# Patient Record
Sex: Male | Born: 1945 | Hispanic: Yes | Smoking: Former smoker
Health system: Southern US, Community
[De-identification: ages and names within clinical notes are randomized; demographics above are authoritative.]

---

## 2006-01-24 HISTORY — PX: INGUINAL HERNIA REPAIR: SUR1180

## 2008-01-25 HISTORY — PX: BRAIN SURGERY: SHX531

## 2011-11-01 DIAGNOSIS — J343 Hypertrophy of nasal turbinates: Secondary | ICD-10-CM | POA: Diagnosis not present

## 2011-11-01 DIAGNOSIS — H905 Unspecified sensorineural hearing loss: Secondary | ICD-10-CM | POA: Diagnosis not present

## 2011-11-01 DIAGNOSIS — J309 Allergic rhinitis, unspecified: Secondary | ICD-10-CM | POA: Diagnosis not present

## 2013-04-22 ENCOUNTER — Ambulatory Visit (INDEPENDENT_AMBULATORY_CARE_PROVIDER_SITE_OTHER): Payer: Medicare Other | Admitting: Internal Medicine

## 2013-04-22 VITALS — BP 118/70 | HR 63 | Temp 97.7°F | Resp 16 | Ht 63.0 in | Wt 136.0 lb

## 2013-04-22 DIAGNOSIS — L500 Allergic urticaria: Secondary | ICD-10-CM | POA: Diagnosis not present

## 2013-04-22 MED ORDER — CETIRIZINE HCL 10 MG PO TABS: 10.0000 mg | ORAL_TABLET | Freq: Two times a day (BID) | ORAL | Status: DC

## 2013-04-22 NOTE — Progress Notes (Signed)
   Subjective:    Patient ID: Alexander Bass, male    DOB: 07/22/1945, 68 y.o.   MRN: [id number]  HPI itchy rash his chief complaint  Facial itching x 4 days intermittent, causes intermittent waking at night due to itch, no new exposures to sprays or smoke,  No runny nose or burning in eyes. rash also noted on neck and on left forearm, and occasionally in genital area and around lower abdomen Penis was itching 2 days ago, has  Since resolved, no problem with urination.  No bruising on body.  No certain new contacts or foods  Past medical history-describes self as healthy with no problems and no medications [place]  Review of Systems No fever chills night sweats or weight loss No allergy symptoms or cough    Objective:   Physical Exam BP 118/70  Pulse 63  Temp(Src) 97.7 F (36.5 C) (Oral)  Resp 16  Ht 5\' 3"  (1.6 m)  Wt 136 lb (61.689 kg)  BMI 24.10 kg/m2  SpO2 100% HEENT clear Skin with resolving and the left forearm and around neck and mild hyperemia of her face that is fading  No adenopathy No petechia or ecchymoses/no vesicular lesions       Assessment & Plan:  Urticaria - Zyrtec bid x 10 days and followup if relapses  [facility] [place] [facility]-

## 2013-05-13 ENCOUNTER — Encounter: Payer: Self-pay | Admitting: *Deleted

## 2013-05-16 ENCOUNTER — Encounter: Payer: Self-pay | Admitting: Nurse Practitioner

## 2013-05-16 ENCOUNTER — Ambulatory Visit (INDEPENDENT_AMBULATORY_CARE_PROVIDER_SITE_OTHER): Payer: Medicare Other | Admitting: Nurse Practitioner

## 2013-05-16 VITALS — BP 124/70 | HR 63 | Temp 96.5°F | Ht 64.25 in | Wt 134.4 lb

## 2013-05-16 DIAGNOSIS — R5381 Other malaise: Secondary | ICD-10-CM

## 2013-05-16 DIAGNOSIS — R079 Chest pain, unspecified: Secondary | ICD-10-CM

## 2013-05-16 DIAGNOSIS — R5383 Other fatigue: Secondary | ICD-10-CM | POA: Diagnosis not present

## 2013-05-16 DIAGNOSIS — Z8249 Family history of ischemic heart disease and other diseases of the circulatory system: Secondary | ICD-10-CM | POA: Diagnosis not present

## 2013-05-16 NOTE — Progress Notes (Addendum)
Patient ID: Alexander Bass, male   DOB: [birth year], 68 y.o.   MRN: [id number]    No Known Allergies  Chief Complaint  Patient presents with  . Establish Care    NP-Establish Care  . Immunizations    declines Shingles, never had Pneumo, and Td was gotten ? when  . other    dizzy (every 3 days), pain in Abdomen after eating, can't hear out of LT ear    HPI: Patient is a 68 y.o. male seen in the office today to establish care Feeling weak and no energy which has been ongoing for a month, sleeping well at night, never been a big eater but has not had a change in appetite. No unexplained weight loss.   Having abdominal discomfort for 2 months, on and off after he eats. Pointing to left middle quadrant Does not report discomfort now. "little bit" of pain, once a week Does not know about screening colonoscopy  Report chest pains occasionally when exercising. Does not happen often, does not associate with shortness of breath or diaphoresis and reports it is really more of a discomfort or tightness, may be related to anxiety Review of Systems:  Review of Systems  Constitutional: Positive for malaise/fatigue. Negative for fever, chills and weight loss.  HENT: Positive for congestion. Negative for ear discharge, ear pain, sore throat and tinnitus. 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   Eyes: Negative for blurred vision.  Respiratory: Negative for cough and shortness of breath.   Cardiovascular: Negative for chest pain and palpitations.  Gastrointestinal: Negative for heartburn, nausea, vomiting, abdominal pain, diarrhea, constipation, blood in stool (history of blood in his stool) and melena.  Genitourinary: Negative for dysuria, urgency and frequency.       Gets up about 2 times at night to urinate  Musculoskeletal: Positive for back pain (at times). Negative for falls, myalgias and neck pain.  Skin: Negative for itching and  rash.  Neurological: Positive for dizziness (at times), tingling (in arms and legs "a little bit") and weakness. Negative for headaches.  Endo/Heme/Allergies: Positive for environmental allergies.  Psychiatric/Behavioral: Positive for depression (at times). The patient is nervous/anxious (son with mental illness causes stress and worry ). The patient does not have insomnia.       History reviewed. No pertinent past medical history. Past Surgical History  Procedure Laterality Date  . 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 Inguinal hernia repair Right 2008   Social History:   reports that he quit smoking about 15 years ago. He has never used smokeless tobacco. He reports that he drinks alcohol. He reports that he does not use illicit drugs.  Family History  Problem Relation Age of Onset  . Cancer Mother   . Heart attack Father   . Heart disease Brother     Medications: Patient's Medications  New Prescriptions   No medications on file  Previous Medications   NON FORMULARY    Vitamin Maka (Puru vitamin) 1 tablet daily  As needed.   VITAMIN E 400 UNIT CAPSULE    Take 400 Units by mouth daily.  Modified Medications   No medications on file  Discontinued Medications   CETIRIZINE (ZYRTEC) 10 MG TABLET    Take 1 tablet (10 mg total) by mouth 2 (two) times daily.     Physical Exam:  Filed Vitals:  05/16/13 0908 05/16/13 0933 05/16/13 0934 05/16/13 0935  BP: 130/76 130/70 120/66 124/70  Pulse: 64 56 57 63  Temp: 96.5 F (35.8 C)     TempSrc: Oral     Height: 5' 4.25" (1.632 m)     Weight: 134 lb 6.4 oz (60.963 kg)       Physical Exam  Constitutional: He is oriented to person, place, and time and well-developed, well-nourished, and in no distress.  HENT:  Head: Normocephalic and atraumatic.  Right Ear: External ear normal.  Left Ear: External ear normal.  Nose: Nose normal.  Mouth/Throat: Oropharynx is clear and moist. No oropharyngeal exudate.    Eyes: Conjunctivae and EOM are normal. Pupils are equal, round, and reactive to light.  Neck: Normal range of motion. Neck supple. No thyromegaly present.  Cardiovascular: Normal rate, regular rhythm and normal heart sounds.   Pulmonary/Chest: Effort normal and breath sounds normal. No respiratory distress. He has no wheezes. He exhibits no tenderness.  Abdominal: Soft. Bowel sounds are normal. He exhibits no distension and no mass. There is no tenderness. There is no rebound and no guarding.  Musculoskeletal: Normal range of motion. He exhibits no edema and no tenderness.  Lymphadenopathy:    He has no cervical adenopathy.  Neurological: He is alert and oriented to person, place, and time.  Skin: Skin is warm and dry.  Psychiatric: Affect normal.     Assessment/Plan 1. Malaise and fatigue -encouraged proper eating habits, will follow up blood work to see if there are any abnormalities, pt not on medications; has not had routine medical care in many years and overdue for blood work - CBC With differential/Platelet - TSH - Comprehensive metabolic panel - EKG 12-Lead  2. Family history of heart disease -egk done WNL , will get lipids  3. Chest pain, unspecified -rare occurrence -EKG done to rule out ischemia without abnormal findings and will get lipid panel at this time   Will have pt follow up in 4 weeks for EV with MMSE due to fatigue and malaise will hold off on pneumonia and tdap vaccines until next visit

## 2013-05-16 NOTE — Patient Instructions (Addendum)
Will get blood work today to evaluate your fatigue  Follow up in 4-6 weeks for physical   Dieta para cuidar el corazn  (Cardiac Diet) Esta dieta puede ayudarlo a prevenir enfermedades del corazn y el ictus. Hay muchos factores que influyen en la salud del corazn como los hbitos alimentarios y la actividad fsica. El riesgo coronario aumenta mucho con los niveles anormales de grasa (lpidos) en la Great Fallssangre. La planificacin de las comidas para cuidar el corazn incluye IAC/InterActiveCorplimitar las grasas no saludables, aumentar las grasas saludables y Radio producerhacer otros pequeos cambios en la dieta. Las pautas generales son:   Ajuste el consumo de caloras para Baristaalcanzar y Pharmacologistmantener un Runner, broadcasting/film/videopeso corporal deseable.  Limite la ingesta de grasas a alrededor del 30% del total de caloras que consume. La cantidad de grasas saturadas debe ser menor al 7% de las caloras.  Las grasas saturadas se encuentran en los productos derivados de animales y en algunos productos vegetales. Las grasas saturadas vegetales estn en el aceite de coco, la Bathmanteca de cacao, el aceite de palma y el aceite de Sparksalmendra de palma. Lea las etiquetas cuidadosamente para evitar estos productos tanto como sea posible. Utilice la mantequilla con moderacin. Elija margarinas de tubo y Enterprise Productsaceites que contienen tienen 2 gramos de grasa o La Grangemenos. Los aceites buenos para cocinar son el de canola y el de [place] Richey Eastoliva.  Aprenda tcnicas para cocinar con poca grasa. No  cocine los alimentos en fritura. En cambio puede asar, hornear, hervir, cocinar al vapor, a la parrilla, asar en la parrilla, salteado o en el microondas. Otras sugerencias para reducir las grasas son:  Chief Strategy OfficerQuitar la piel de las aves.  Retirar toda la grasa visible de las carnes.  Retire la grasa de los guisos, sopas y salsas antes de Secretary/administratorservir.  Cocine las verduras al vapor en agua o caldo en vez de saltearlos en grasa.  Evite los alimentos con grasas trans (aceites hidrogenados), como los alimentos fritos y los  productos comerciales horneados. La Phillips Groutmanteca y las grasas para frer contienen grasas trans.  Aumente el consumo de frutas, verduras, cereales integrales y legumbres para reemplazar los alimentos con alto contenido de Avery Creekgrasa.  Aumente el consumo de frutos secos, legumbres y semillas a por lo menos 4 porciones semanales. Una porcin de legumbre equivale a  taza y 1 porcin de nueces o semillas equivale a  de taza.  Elija granos enteros con ms frecuencia. Consuma 3 porciones al da (una porcin es 1 onza [oz] o 28 gr).  Coma 4 a 5 porciones de vegetales por da. Una porcin de vegetales equivale a 1 taza de verduras de hoja crudas,  taza de vegetales trozados crudos o cocidos,  taza de jugo de verduras.  Consuma 4 a 5 porciones de fruta al C.H. Robinson Worldwideda. Una porcin de fruta equivale a 1 fruta mediana entera,  de taza de fruta seca,  taza de fruta fresca, congelada o enlatada,  taza de100% jugo de fruta .  Aumente el consumo de fibra dietaria de 20 a 30 gramos por Futures traderda. La fibra insoluble ayuda a reducir el riesgo de enfermedades cardacas y a Scientist, water qualityfrenar el apetito.  La fibra soluble se une al colesterol para eliminarlo de Risk managerla sangre. Los alimentos con alto contenido de Guyanafibra soluble son legumbres secas, ctricos,avena, manzanas, bananas, brcoli, repollitos de Bruselas y berenjenas.   Incluya alimentos enriquecidos con esteroles o General Motorsestanoles vegetales, como el yogur, panes, jugos, o Courtlandmargarinas. Elija alimentos fortificados para lograr una ingesta diaria de 2 a 3 gramos  de esteroles o estanoles vegetales.  Los alimentos con grasas omega-3 ayudan a Software engineerreducir el riesgo de enfermedades cardacas. Consuma una porcin de 3,5 onzas (100 gr) de Merck & Copescado graso dos veces por semana, como salmn, caballa, atn Tano Roadalbacora, sardinas, trucha de Evanstonlago, o arenque. Si desea tomar un suplemento de aceite de pescado, elija uno que contenga 1 gr de DHA y EPA.  Limite el consumo de carnes procesadas a 2 porciones (porcin de 3 oz [85  gr]) semanales.  Limite el sodio en la dieta a 1500 miligramos (mg) por da. Si sufre hipertensin, hable con un nutricionista matriculado cerca del plan de alimentacin DASH (siglas en ingls de Enfoques Alimentarios para Detener la Hipertensin ) .  Limite los dulces y las bebidas con azcar agregado, como gaseosas, a no ms de 5 porciones por semana. Una porcin es:   1 cucharada de azcar.  1 cucharada de Palestinian Territoryjalea o mermelada.   taza de sorbete.  1 taza de limonada.   taza de gaseosa comn. ELECCIN DE LOS ALIMENTOS  FCULAS  Permitidos: Panes: [address]odos los tipos (trigo, centeno, de pasas, Garden Prairieblanco, de Minneolaavena, New Auburnitaliano, francs y Patent examinerMuffin Ingls). Rollos con bajo contenido en grasa: Muffins ingleses, panecillos frankfurt y de hamburguesas, bagels, pan de pita, tortillas (no fritas). Panqueques, waffles, galletas y magdalenas hechas con aceite recomendado.  Evite: Productos elaborados con grasas saturadas o trans, aceites o productos elaborados con Eastman Kodakleche entera. Panecillos de mantequilla, pan de queso, croissants. Donas, muffins, panecillos dulces, galletas, waffles, panqueques, mezclas compradas en la tienda . Galletas   Permitidos: GafferGalletas y snacks bajos en grasa: GafferGalletas con forma de Fairmeadanimales, de graham, centeno, de agua (con el aceite recomendado, sin Colorado Citymanteca de cerdo), la Mesquiteostra, y las galletas de Honokaamatz. Grisines, biscotes, bay biscuits, pan rabe, pretzels y palomitas de USAAmaiz diet.  Evite: Las galletas con alto contenido de grasa: Gaffergalletas de Hollandqueso, Vine Grovegalletas de Wenonamantequilla, y los hechas con aceite de de palma o grasas trans (aceites hidrogenados). Palomitas de maz con mantequilla. Cereales  Permitidos: Cereales integrales calientes o fros.  Evite: Cereales que contengan coco, grasa vegetal hidrogenada, o grasa animal. Patatas / pastas / arroz:  Permitidos: Todas las clases de papas, arroz y pasta (como Urbancrestmacarrones, espaguetis y fideos).  Evite: Pasta o arroz preparado con salsa  de crema o queso rico en grasas. Fideos chow mein, papas fritas. Vegetales:  Permitidos: Todos los vegetales y jugos de Sports administratorvegetales.  Evite: Verduras fritas Verduras con crema, mantequilla o salsas de queso con alto contenido de grasa. Limite el consumo de coco. La fruta con crema o natillas. Protenas  Permitidos: Limite el consumo de carnes, mariscos, y aves a no ms de 6 onzas (peso cocido) por Futures traderda. Safeco Corporationodos los cortes de carne magra de Winthropternera, cerdo y cordero. Pollo y pavo sin piel. Todos los pescados y Liberty Globalmariscos. Presas de caza: pato salvaje, conejo, faisn y venado. Las claras de CHS Inchuevo o sustitutos de [facility] Inchuevo con poco colesterol pueden usarse tanto como se desee. Platos sin carne: Recetas con judas secas, guisantes, lentejas y tofu (cuajada de soja). Semillas y frutos secos: todas las semillas y la mayora de los frutos secos.  Evite: Carnes "Prime" y otras carnes grasas y "veteadas" como costillas, costillas de cerdo, ojo de bife o bistec, salchichas, panceta y carnes con alto contenido de grasas y embutidos de cordero. Caviar Pescado frito comercial. Pato domstico, ganso, salchichas de venado. Vsceras: hgado, molleja, corazn, chinchulines, riones, sesos. Lcteos:  Permitidos: Quesos bajos en grasa: queso cottage sin grasa o Patrecia Pacebajo  en grasa (1% o 2%), quesos elaborados con Engelhard Corporation, como [place], de [place], en hebras o ricotta. (Los quesos que indican en su etiqueta que no tienen ms de 2 a 6 gramos de grasa por onza (28 gr.). Leche descremada (al 1%): lquida, en polvo, o evaporada. Mantequilla hecha con leche baja en grasa. Bebidas preparadas con PPG Industries o leche o cacao bajos en grasa . La leche con chocolate o cacao preparado con leche descremada o baja en grasa (1%). Yogur descremado o bajo en grasas.  Evite: Quesos de [address], incluyendo [place], [place], [place], [place], [place], [place], [place], [place], [place], y [place]. Queso cottage de crema,  queso crema. Leche entera y productos lcteos enteros, incluyendo [place] o yogurt elaborado con [place], [place] a base de [place]. Leche condensada, leche entera evaporada y de [place] al 2% Sopas y alimentos combinados  Permitidos: Sopas con bajo contenido de grasa y sodio: caldos, sopas deshidratadas, caldos caseros, sopas caseras eliminando la grasa, sopas cremas caseras hechas con leche descremada o baja en grasas. Espaguetis con bajo contenido de grasa, lasaa, chili y arroz espaoles si se elaboran con ingredientes bajos en grasa y se utilizan tcnicas de coccin sin grasas.  Evite: Sopas de crema hecha con Eastman Kodak, crema, quesos grasos. Todas las otras sopas. Postres y dulces  Permitidos: Sorbetes, helados de frutas, gelatinas, merengues y torta ngel. Postres caseros con grasas, aceites y productos lcteos recomendados. Mermelada, jalea, miel, gelatina, azcares y jarabes. Caramelos de Science Applications International, como gomitas, caramelos duros, caramelos de goma, malvaviscos, caramelos de menta, y pequeas cantidades de chocolate negro.  Evite: Tortas, pasteles, galletas, helados, budines comerciales o mezclas para preparar estos productos. Postres que Mellon Financial, chocolate, coco, Macy de [place], aceite de palma o aceite de [place]. Helado o bebidas heladas. Caramelos que contengan chocolate, coco, manteca, grasas hidrogenadas, o ingredientes desconocidos. Jarabes con mantequilla. Grasas y aceites  Permitidos: Aceites vegetales: de crtamo, girasol, maz, soja, ssamo, algodn, canola, oliva o cacahuete. Margarinas no hidrogenadas. Aderezos para ensaladas o mayonesa: casera o comercial preparadas con el aceite recomendado. Aderezo para ensalada o [place] con bajo contenido o sin grasas.  Limite las grasas y Enterprise Products agregue a 6 - 8 cucharaditas por da (incluye las grasas utilizadas en la cocina, [place], [place], y la que se extiende sobre el pan). Recuerde contar los  "grasas ocultas" en los alimentos.  Evite: Las grasas slidas: mantequilla, [place], tocino, grasa de tocino. Salsas de carne que contengan [place], [place] o sebo. La manteca de cacao, el coco. Aceite de [place], aceite de [place], aceite de Brune[place] Darussalam de palma o aceites hidrogenados: estos ingredientes son de uso frecuente en productos de panadera, cremas no lcteas, coberturas batidas, dulces, y alimentos fritos comerciales. Lea las etiquetas atentamente. Aderezos para ensaladas a base de aceites desconocidos, crema, o queso, como el queso azul y roquefort. [place]remas, todo tipo: mitad y [address], diet, entera, o batida. Queso crema o crema agria (aunque sea "light" o bajas en grasa). Sustitutos no lcteos de la crema: cremas para caf y sucedneos de la crema hechos con palma, nuez de palma, aceites hidrogenados o aceite de coco. Bebidas  Permitidos: Caf (comn o descafeinado), t. Gaseosas dietticas, agua mineral. Alcohol: Consulte con su mdico. Se recomienda beber con moderacin.  Evite: La leche entera, gaseosas comunes y jugos con Engineer, mining. Condimentos  Permitidos: Todos los condimentos y aderezos. Cacao en polvo. Salsas "cremosas" preparadas con los ingredientes recomendados.  Evite: Algarroba en polvo preparada con Rosalin Hawking  hidrogenadas. EJEMPLO DE MEN  Desayuno   taza de jugo de naranjas   taza de avena cocida  1 rebanada tostado  1 cucharada de margarina  1 taza de [place] descremada [place]:  Sandwich de pavo con 60 gr de pavo y 2 rebanadas de pan.  [place] y rodajas de tomate  Frutas frescas  Palitos de zanahoria  T o caf. Colacin  Frutas frescas o galletas sin contenido de [place]ale. Cena  3 onzas (100 gr. de carne picada)  1 patata al horno  1 cucharada de margarina   taza de esprragos  Ensalada de lechuga  1 cucharada de aderezo para ensaladas sin crema.   taza de rebanadas de pepino  1 taza de [place] descremada Document Released: 11/07/2008 Document  Revised: 07/12/2011 [facility] Patient Information 2014 [place], [place].

## 2013-05-17 ENCOUNTER — Encounter: Payer: Self-pay | Admitting: *Deleted

## 2013-05-17 LAB — CBC WITH DIFFERENTIAL
BASOS: 0 %
Basophils Absolute: 0 10*3/uL (ref 0.0–0.2)
EOS: 1 %
Eosinophils Absolute: 0 10*3/uL (ref 0.0–0.4)
HEMATOCRIT: 42.6 % (ref 37.5–51.0)
HEMOGLOBIN: 14.6 g/dL (ref 12.6–17.7)
Immature Grans (Abs): 0 10*3/uL (ref 0.0–0.1)
Immature Granulocytes: 0 %
LYMPHS ABS: 1.4 10*3/uL (ref 0.7–3.1)
Lymphs: 28 %
MCH: 30 pg (ref 26.6–33.0)
MCHC: 34.3 g/dL (ref 31.5–35.7)
MCV: 88 fL (ref 79–97)
MONOS ABS: 0.5 10*3/uL (ref 0.1–0.9)
Monocytes: 10 %
NEUTROS ABS: 3 10*3/uL (ref 1.4–7.0)
Neutrophils Relative %: 61 %
Platelets: 311 10*3/uL (ref 150–379)
RBC: 4.87 x10E6/uL (ref 4.14–5.80)
RDW: 13.1 % (ref 12.3–15.4)
WBC: 4.9 10*3/uL (ref 3.4–10.8)

## 2013-05-17 LAB — COMPREHENSIVE METABOLIC PANEL
ALBUMIN: 4.2 g/dL (ref 3.6–4.8)
ALT: 11 IU/L (ref 0–44)
AST: 22 IU/L (ref 0–40)
Albumin/Globulin Ratio: 1.4 (ref 1.1–2.5)
Alkaline Phosphatase: 87 IU/L (ref 39–117)
BILIRUBIN TOTAL: 0.4 mg/dL (ref 0.0–1.2)
BUN/Creatinine Ratio: 13 (ref 10–22)
BUN: 12 mg/dL (ref 8–27)
CHLORIDE: 105 mmol/L (ref 97–108)
CO2: 22 mmol/L (ref 18–29)
CREATININE: 0.93 mg/dL (ref 0.76–1.27)
Calcium: 9 mg/dL (ref 8.6–10.2)
GFR calc non Af Amer: 83 mL/min/{1.73_m2} (ref >59)
GFR, EST AFRICAN AMERICAN: 96 mL/min/{1.73_m2} (ref >59)
Globulin, Total: 3 g/dL (ref 1.5–4.5)
Glucose: 81 mg/dL (ref 65–99)
Potassium: 4.4 mmol/L (ref 3.5–5.2)
Sodium: 143 mmol/L (ref 134–144)
TOTAL PROTEIN: 7.2 g/dL (ref 6.0–8.5)

## 2013-05-17 LAB — LIPID PANEL
Chol/HDL Ratio: 3.5 ratio units (ref 0.0–5.0)
Cholesterol, Total: 221 mg/dL — ABNORMAL HIGH (ref 100–199)
HDL: 64 mg/dL (ref >39)
LDL Calculated: 144 mg/dL — ABNORMAL HIGH (ref 0–99)
TRIGLYCERIDES: 64 mg/dL (ref 0–149)
VLDL CHOLESTEROL CAL: 13 mg/dL (ref 5–40)

## 2013-05-17 LAB — TSH: TSH: 2.29 u[IU]/mL (ref 0.450–4.500)

## 2013-06-20 ENCOUNTER — Ambulatory Visit (INDEPENDENT_AMBULATORY_CARE_PROVIDER_SITE_OTHER): Payer: Medicare Other | Admitting: Nurse Practitioner

## 2013-06-20 ENCOUNTER — Encounter: Payer: Self-pay | Admitting: Nurse Practitioner

## 2013-06-20 VITALS — BP 122/64 | HR 58 | Temp 98.3°F | Ht 64.25 in | Wt 128.0 lb

## 2013-06-20 DIAGNOSIS — E785 Hyperlipidemia, unspecified: Secondary | ICD-10-CM | POA: Diagnosis not present

## 2013-06-20 DIAGNOSIS — J309 Allergic rhinitis, unspecified: Secondary | ICD-10-CM | POA: Diagnosis not present

## 2013-06-20 DIAGNOSIS — Z23 Encounter for immunization: Secondary | ICD-10-CM | POA: Diagnosis not present

## 2013-06-20 DIAGNOSIS — Z0000 Encounter for general adult medical examination without abnormal findings: Secondary | ICD-10-CM | POA: Diagnosis not present

## 2013-06-20 DIAGNOSIS — J302 Other seasonal allergic rhinitis: Secondary | ICD-10-CM

## 2013-06-20 NOTE — Progress Notes (Signed)
Passed clock drawing 

## 2013-06-20 NOTE — Patient Instructions (Signed)
Follow up in 3 months with fasting lipids prior to visit   Cont diet modifications and exercise  Cardiac Diet This diet can help prevent heart disease and stroke. Many factors influence your heart health, including eating and exercise habits. Coronary risk rises a lot with abnormal blood fat (lipid) levels. Cardiac meal planning includes limiting unhealthy fats, increasing healthy fats, and making other small dietary changes. General guidelines are as follows:  Adjust calorie intake to reach and maintain desirable body weight.  Limit total fat intake to less than 30% of total calories. Saturated fat should be less than 7% of calories.  Saturated fats are found in animal products and in some vegetable products. Saturated vegetable fats are found in coconut oil, cocoa butter, palm oil, and palm kernel oil. Read labels carefully to avoid these products as much as possible. Use butter in moderation. Choose tub margarines and oils that have 2 grams of fat or less. Good cooking oils are canola and olive oils.  Practice low-fat cooking techniques. Do not fry food. Instead, broil, bake, boil, steam, grill, roast on a rack, stir-fry, or microwave it. Other fat reducing suggestions include:  Remove the skin from poultry.  Remove all visible fat from meats.  Skim the fat off stews, soups, and gravies before serving them.  Steam vegetables in water or broth instead of sauting them in fat.  Avoid foods with trans fat (or hydrogenated oils), such as commercially fried foods and commercially baked goods. Commercial shortening and deep-frying fats will contain trans fat.  Increase intake of fruits, vegetables, whole grains, and legumes to replace foods high in fat.  Increase consumption of nuts, legumes, and seeds to at least 4 servings weekly. One serving of a legume equals  cup, and 1 serving of nuts or seeds equals  cup.  Choose whole grains more often. Have 3 servings per day (a serving is 1  ounce [oz]).  Eat 4 to 5 servings of vegetables per day. A serving of vegetables is 1 cup of raw leafy vegetables;  cup of raw or cooked cut-up vegetables;  cup of vegetable juice.  Eat 4 to 5 servings of fruit per day. A serving of fruit is 1 medium whole fruit;  cup of dried fruit;  cup of fresh, frozen, or canned fruit;  cup of 100% fruit juice.  Increase your intake of dietary fiber to 20 to 30 grams per day. Insoluble fiber may help lower your risk of heart disease and may help curb your appetite.  Soluble fiber binds cholesterol to be removed from the blood. Foods high in soluble fiber are dried beans, citrus fruits, oats, apples, bananas, broccoli, Brussels sprouts, and eggplant.  Try to include foods fortified with plant sterols or stanols, such as yogurt, breads, juices, or margarines. Choose several fortified foods to achieve a daily intake of 2 to 3 grams of plant sterols or stanols.  Foods with omega-3 fats can help reduce your risk of heart disease. Aim to have a 3.5 oz portion of fatty fish twice per week, such as salmon, mackerel, albacore tuna, sardines, lake trout, or herring. If you wish to take a fish oil supplement, choose one that contains 1 gram of both DHA and EPA.  Limit processed meats to 2 servings (3 oz portion) weekly.  Limit the sodium in your diet to 1500 milligrams (mg) per day. If you have high blood pressure, talk to a registered dietitian about a DASH (Dietary Approaches to Stop Hypertension) eating plan.  Limit sweets and beverages with added sugar, such as soda, to no more than 5 servings per week. One serving is:   1 tablespoon sugar.  1 tablespoon jelly or jam.   cup sorbet.  1 cup lemonade.   cup regular soda. CHOOSING FOODS Starches  Allowed: Breads: All kinds (wheat, rye, raisin, white, oatmeal, [place], [place], and English muffin bread). Low-fat rolls: English muffins, frankfurter and hamburger buns, bagels, pita bread, tortillas (not  fried). Pancakes, waffles, biscuits, and muffins made with recommended oil.  Avoid: Products made with saturated or trans fats, oils, or whole milk products. Butter rolls, cheese breads, croissants. Commercial doughnuts, muffins, sweet rolls, biscuits, waffles, pancakes, store-bought mixes. Crackers  Allowed: Low-fat crackers and snacks: Animal, graham, rye, saltine (with recommended oil, no lard), oyster, and matzo crackers. Bread sticks, melba toast, rusks, flatbread, pretzels, and light popcorn.  Avoid: High-fat crackers: cheese crackers, butter crackers, and those made with coconut, palm oil, or trans fat (hydrogenated oils). Buttered popcorn. Cereals  Allowed: Hot or cold whole-grain cereals.  Avoid: Cereals containing coconut, hydrogenated vegetable fat, or animal fat. Potatoes / Pasta / Rice  Allowed: All kinds of potatoes, rice, and pasta (such as macaroni, spaghetti, and noodles).  Avoid: Pasta or rice prepared with cream sauce or high-fat cheese. Chow mein noodles, [place] fries. Vegetables  Allowed: All vegetables and vegetable juices.  Avoid: Fried vegetables. Vegetables in cream, butter, or high-fat cheese sauces. Limit coconut. Fruit in cream or custard. Protein  Allowed: Limit your intake of meat, seafood, and poultry to no more than 6 oz (cooked weight) per day. All lean, well-trimmed beef, veal, pork, and lamb. All chicken and [place] without skin. All fish and shellfish. Wild game: wild duck, rabbit, pheasant, and venison. Egg whites or low-cholesterol egg substitutes may be used as desired. Meatless dishes: recipes with dried beans, peas, lentils, and tofu (soybean curd). Seeds and nuts: all seeds and most nuts.  Avoid: Prime grade and other heavily marbled and fatty meats, such as short ribs, spare ribs, rib eye roast or steak, frankfurters, sausage, bacon, and high-fat luncheon meats, mutton. Caviar. Commercially fried fish. Domestic duck, goose, venison sausage. Organ  meats: liver, gizzard, heart, chitterlings, brains, kidney, sweetbreads. Dairy  Allowed: Low-fat cheeses: nonfat or low-fat cottage cheese (1% or 2% fat), cheeses made with part skim milk, such as mozzarella, farmers, string, or ricotta. (Cheeses should be labeled no more than 2 to 6 grams fat per oz.). Skim (or 1%) milk: liquid, powdered, or evaporated. Buttermilk made with low-fat milk. Drinks made with skim or low-fat milk or cocoa. Chocolate milk or cocoa made with skim or low-fat (1%) milk. Nonfat or low-fat yogurt.  Avoid: Whole milk cheeses, including colby, cheddar, [place], [address], [place], [place], [place], [address], [place], and blue. Creamed cottage cheese, cream cheese. Whole milk and whole milk products, including buttermilk or yogurt made from whole milk, drinks made from whole milk. Condensed milk, evaporated whole milk, and 2% milk. Soups and Combination Foods  Allowed: Low-fat low-sodium soups: broth, dehydrated soups, homemade broth, soups with the fat removed, homemade cream soups made with skim or low-fat milk. Low-fat spaghetti, lasagna, chili, and Spanish rice if low-fat ingredients and low-fat cooking techniques are used.  Avoid: Cream soups made with whole milk, cream, or high-fat cheese. All other soups. Desserts and Sweets  Allowed: Sherbet, fruit ices, gelatins, meringues, and angel food cake. Homemade desserts with recommended fats, oils, and milk products. Jam, jelly, honey, marmalade, sugars, and syrups. Pure sugar candy, such  as gum drops, hard candy, jelly beans, marshmallows, mints, and small amounts of dark chocolate.  Avoid: Commercially prepared cakes, pies, cookies, frosting, pudding, or mixes for these products. Desserts containing whole milk products, chocolate, coconut, lard, palm oil, or palm kernel oil. Ice cream or ice cream drinks. Candy that contains chocolate, coconut, butter, hydrogenated fat, or unknown ingredients. Buttered syrups. Fats and  Oils  Allowed: Vegetable oils: safflower, sunflower, corn, soybean, cottonseed, sesame, canola, olive, or peanut. Non-hydrogenated margarines. Salad dressing or mayonnaise: homemade or commercial, made with a recommended oil. Low or nonfat salad dressing or mayonnaise.  Limit added fats and oils to 6 to 8 tsp per day (includes fats used in cooking, baking, salads, and spreads on bread). Remember to count the "hidden fats" in foods.  Avoid: Solid fats and shortenings: butter, lard, salt pork, bacon drippings. Gravy containing meat fat, shortening, or suet. Cocoa butter, coconut. Coconut oil, palm oil, palm kernel oil, or hydrogenated oils: these ingredients are often used in bakery products, nondairy creamers, whipped toppings, candy, and commercially fried foods. Read labels carefully. Salad dressings made of unknown oils, sour cream, or cheese, such as blue cheese and Roquefort. Cream, all kinds: half-and-half, light, heavy, or whipping. Sour cream or cream cheese (even if "light" or low-fat). Nondairy cream substitutes: coffee creamers and sour cream substitutes made with palm, palm kernel, hydrogenated oils, or coconut oil. Beverages  Allowed: Coffee (regular or decaffeinated), tea. Diet carbonated beverages, mineral water. Alcohol: Check with your caregiver. Moderation is recommended.  Avoid: Whole milk, regular sodas, and juice drinks with added sugar. Condiments  Allowed: All seasonings and condiments. Cocoa powder. "Cream" sauces made with recommended ingredients.  Avoid: Carob powder made with hydrogenated fats. SAMPLE MENU Breakfast   cup orange juice   cup oatmeal  1 slice toast  1 tsp margarine  1 cup skim milk Lunch  [place] sandwich with 2 oz [place], 2 slices bread  Lettuce and tomato slices  Fresh fruit  Carrot sticks  Coffee or tea Snack  Fresh fruit or low-fat crackers Dinner  3 oz lean ground beef  1 baked potato  1 tsp margarine   cup  asparagus  Lettuce salad  1 tbs non-creamy dressing   cup peach slices  1 cup skim milk Document Released: 10/20/2007 Document Revised: 07/12/2011 Document Reviewed: 04/05/2011 Exit[employer] Patient Information 2014 [employer] [place].

## 2013-06-20 NOTE — Progress Notes (Signed)
Patient ID: Alexander Bass, male   DOB: 1945/05/22, 68 y.o.   MRN: [id number]    No Known Allergies  Chief Complaint  Patient presents with  . Medical Management of Chronic Issues    Comprehensive exam  . Leg Pain    right upper leg when he sets down occasionally   . Back Pain    left lower back for couple of days    HPI: Patient is a 68 y.o. male seen in the office today for physical Has never been seen routinely Went to [facility] due to face itching and was dx with seasonal allergies and placed on zyrtec, he does not take this currently Since he was dx with high cholesterol he walks 45 mins 5 days a week  Trying to eat better Some days he feels better than others   Review of Systems:  Review of Systems  Constitutional: Positive for weight loss (has increased excerise and eating better due to high cholesterol) and malaise/fatigue. Negative for fever and chills.  HENT: Negative for congestion, ear discharge, ear pain, nosebleeds, sore throat and tinnitus. 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   Eyes: Negative for blurred vision.  Respiratory: Negative for cough and shortness of breath.   Cardiovascular: Negative for chest pain and palpitations.  Gastrointestinal: Negative for heartburn, nausea, vomiting, abdominal pain, diarrhea, constipation, blood in stool and melena.       Colonoscopy than 5-10 years ago- had this done in [place] where he lived until 2014  Genitourinary: Negative for dysuria, urgency and frequency.       Gets up about 2 times at night to urinate  Musculoskeletal: Positive for back pain (at times). Negative for falls, myalgias and neck pain.  Skin: Negative for itching and rash.  Neurological: Positive for dizziness (at times) and weakness. Negative for headaches.  Endo/Heme/Allergies: Positive for environmental allergies.  Psychiatric/Behavioral: Negative for depression. The patient is not  nervous/anxious and does not have insomnia.      History reviewed. No pertinent past medical history. Past Surgical History  Procedure Laterality Date  . 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 Inguinal hernia repair Right 2008   Social History:   reports that he quit smoking about 15 years ago. He has never used smokeless tobacco. He reports that he does not drink alcohol or use illicit drugs.  Family History  Problem Relation Age of Onset  . Cancer Mother   . Heart attack Father   . Heart disease Brother     Medications: Patient's Medications  New Prescriptions   No medications on file  Previous Medications   NON FORMULARY    Vitamin Maka (Puru vitamin) 1 tablet daily  As needed.   VITAMIN E 400 UNIT CAPSULE    Take 400 Units by mouth daily.  Modified Medications   No medications on file  Discontinued Medications   No medications on file     Physical Exam:  Filed Vitals:   06/20/13 1124  BP: 122/64  Pulse: 58  Temp: 98.3 F (36.8 C)  Height: 5' 4.25" (1.632 m)  Weight: 128 lb (58.06 kg)  SpO2: 98%    Physical Exam  Constitutional: He is oriented to person, place, and time and well-developed, well-nourished, and in no distress.  HENT:  Head: Normocephalic and atraumatic.  Right Ear: External ear normal.  Left Ear:  External ear normal.  Nose: Nose normal.  Mouth/Throat: Oropharynx is clear and moist. No oropharyngeal exudate.  Eyes: Conjunctivae and EOM are normal. Pupils are equal, round, and reactive to light.  Neck: Normal range of motion. Neck supple. No thyromegaly present.  Cardiovascular: Normal rate, regular rhythm, normal heart sounds and intact distal pulses.   Pulmonary/Chest: Effort normal and breath sounds normal. No respiratory distress. He has no wheezes. He exhibits no tenderness.  Abdominal: Soft. Bowel sounds are normal. He exhibits no distension and no mass. There is no tenderness. There is no rebound and no  guarding.  Genitourinary: Rectum normal, prostate normal and penis normal. Guaiac stool: no stool in rectum. No discharge found.  Musculoskeletal: Normal range of motion. He exhibits no edema and no tenderness.  Lymphadenopathy:    He has no cervical adenopathy.  Neurological: He is alert and oriented to person, place, and time. He has normal reflexes. Gait normal.  Skin: Skin is warm and dry.  Psychiatric: Affect normal.     Labs reviewed: Basic Metabolic Panel:  Recent Labs  19/14/7804/23/15 1053  NA 143  K 4.4  CL 105  CO2 22  GLUCOSE 81  BUN 12  CREATININE 0.93  CALCIUM 9.0  TSH 2.290   Liver Function Tests:  Recent Labs  05/16/13 1053  AST 22  ALT 11  ALKPHOS 87  BILITOT 0.4  PROT 7.2   No results found for this basename: LIPASE, AMYLASE,  in the last 8760 hours No results found for this basename: AMMONIA,  in the last 8760 hours CBC:  Recent Labs  05/16/13 1053  WBC 4.9  NEUTROABS 3.0  HGB 14.6  HCT 42.6  MCV 88  PLT 311   Lipid Panel:  Recent Labs  05/16/13 1053  HDL 64  LDLCALC 144*  TRIG 64  CHOLHDL 3.5   TSH:  Recent Labs  05/16/13 1053  TSH 2.290   A1C: No components found with this basename: A1C,    Assessment/Plan  1. Other and unspecified hyperlipidemia -cont lifestyle modifications, diet and exercise discussed - Lipid panel; Future  2. Need for prophylactic vaccination against Streptococcus pneumoniae (pneumococcus) - Pneumococcal conjugate vaccine 13-valent  3. Seasonal Allergies -cont zyrtec as needed  4. ROUTINE GENERAL MEDICAL EXAMINATION AT HEALTH CARE  Assessment/PLAN The patient is doing well and no distinct problems were identified on exam. PREVENTIVE COUNSELING:  The patient was counseled regarding the appropriate use of alcohol, prevention of dental and periodontal disease, diet, regular sustained exercise for at least 30 minutes 5 times per week, testicular self-examination on a monthly basis,smoking cessation,  the proper use of sunscreen and protective clothing, tobacco use,  and recommended schedule for GI hemoccult testing, colonoscopy, cholesterol, thyroid and diabetes screening. Pt reports he does not want screening coloscopy at this time. States it has been less than 10 years but can not tell me date or where or recommendations

## 2013-09-01 ENCOUNTER — Ambulatory Visit (INDEPENDENT_AMBULATORY_CARE_PROVIDER_SITE_OTHER): Payer: Medicare Other

## 2013-09-01 ENCOUNTER — Ambulatory Visit (INDEPENDENT_AMBULATORY_CARE_PROVIDER_SITE_OTHER): Payer: Medicare Other | Admitting: Emergency Medicine

## 2013-09-01 VITALS — BP 120/66 | HR 67 | Temp 97.9°F | Resp 20 | Ht 63.0 in | Wt 125.1 lb

## 2013-09-01 DIAGNOSIS — S20219A Contusion of unspecified front wall of thorax, initial encounter: Secondary | ICD-10-CM

## 2013-09-01 DIAGNOSIS — S20212A Contusion of left front wall of thorax, initial encounter: Secondary | ICD-10-CM

## 2013-09-01 DIAGNOSIS — N400 Enlarged prostate without lower urinary tract symptoms: Secondary | ICD-10-CM

## 2013-09-01 LAB — POCT URINALYSIS DIPSTICK
Blood, UA: NEGATIVE
Glucose, UA: NEGATIVE
Ketones, UA: 40
Leukocytes, UA: NEGATIVE
Nitrite, UA: NEGATIVE
PROTEIN UA: NEGATIVE
UROBILINOGEN UA: 0.2
pH, UA: 5.5

## 2013-09-01 LAB — POCT UA - MICROSCOPIC ONLY
BACTERIA, U MICROSCOPIC: NEGATIVE
Casts, Ur, LPF, POC: NEGATIVE
Crystals, Ur, HPF, POC: NEGATIVE
RBC, URINE, MICROSCOPIC: NEGATIVE
WBC, Ur, HPF, POC: NEGATIVE
Yeast, UA: NEGATIVE

## 2013-09-01 MED ORDER — TAMSULOSIN HCL 0.4 MG PO CAPS: 0.4000 mg | ORAL_CAPSULE | Freq: Every day | ORAL | Status: DC

## 2013-09-01 MED ORDER — TRAMADOL HCL 50 MG PO TABS: 50.0000 mg | ORAL_TABLET | Freq: Three times a day (TID) | ORAL | Status: DC | PRN
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
## 2013-09-01 NOTE — Patient Instructions (Signed)
Benign Prostatic Hypertrophy The prostate gland is part of the reproductive system of men. A normal prostate is about the size and shape of a walnut. The prostate gland produces a fluid that is mixed with sperm to make semen. This gland surrounds the urethra and is located in front of the rectum and just below the bladder. The bladder is where urine is stored. The urethra is the tube through which urine passes from the bladder to get out of the body. The prostate grows as a man ages. An enlarged prostate not caused by cancer is called benign prostatic hypertrophy (BPH). An enlarged prostate can press on the urethra. This can make it harder to pass urine. In the early stages of enlargement, the bladder can get by with a narrowed urethra by forcing the urine through. If the problem gets worse, medical or surgical treatment may be required.  This condition should be followed by your health care provider. The accumulation of urine in the bladder can cause infection. Back pressure and infection can progress to bladder damage and kidney (renal) failure. If needed, your health care provider may refer you to a specialist in kidney and prostate disease (urologist). CAUSES  BPH is a common health problem in men older than 50 years. This condition is a normal part of aging. However, not all men will develop problems from this condition. If the enlargement grows away from the urethra, then there will not be any compression of the urethra and resistance to urine flow.If the growth is toward the urethra and compresses it, you will experience difficulty urinating.  SYMPTOMS   Not able to completely empty your bladder.  Getting up often during the night to urinate.  Need to urinate frequently during the day.  Difficultly starting urine flow.  Decrease in size and strength of your urine stream.  Dribbling after urination.  Pain on urination (more common with infection).  Inability to pass urine. This needs  immediate treatment.  The development of a urinary tract infection. DIAGNOSIS  These tests will help your health care provider understand your problem:  A thorough history and physical examination.  A urination history, with the number of times you urinate, the amounts of urine, the strength of the urine stream, and the feeling of emptiness or fullness after urinating.  A postvoid bladder scan that measures any amount of urine that may remain in your bladder after you finish urinating.  Digital rectal exam. In a rectal exam, your health care provider checks your prostate by putting a gloved, lubricated finger into your rectum to feel the back of your prostate gland. This exam detects the size of your gland and abnormal lumps or growths.  Exam of your urine (urinalysis).  Prostate specific antigen (PSA) screening. This is a blood test used to screen for prostate cancer.  Rectal ultrasonography. This test uses sound waves to electronically produce a picture of your prostate gland. TREATMENT  Once symptoms begin, your health care provider will monitor your condition. Of the men with this condition, one third will have symptoms that stabilize, one third will have symptoms that improve, and one third will have symptoms that progress in the first year. Mild symptoms may not need treatment. Simple observation and yearly exams may be all that is required. Medicines and surgery are options for more severe problems. Your health care provider can help you make an informed decision for what is best. Two classes of medicines are available for relief of prostate symptoms:  Medicines   that shrink the prostate. This helps relieve symptoms. These medicines take time to work, and it may be months before any improvement is seen.  Uncommon side effects include problems with sexual function.  Medicines to relax the muscle of the prostate. This also relieves the obstruction by reducing any compression on the  urethra.This group of medicines work much faster than those that reduce the size of the prostate gland. Usually, one can experience improvement in days to weeks..  Side effects can include dizziness, fatigue, lightheadedness, and retrograde ejaculation (diminished volume of ejaculate). Several types of surgical treatments are available for relief of prostate symptoms:  Transurethral resection of the prostate (TURP)--In this treatment, an instrument is inserted through opening at the tip of the penis. It is used to cut away pieces of the inner core of the prostate. The pieces are removed through the same opening of the penis. This removes the obstruction and helps get rid of the symptoms.  Transurethral incision (TUIP)--In this procedure, small cuts are made in the prostate. This lessens the prostates pressure on the urethra.  Transurethral microwave thermotherapy (TUMT)--This procedure uses microwaves to create heat. The heat destroys and removes a small amount of prostate tissue.  Transurethral needle ablation (TUNA)--This is a procedure that uses radio frequencies to do the same as TUMT.  Interstitial laser coagulation (ILC)--This is a procedure that uses a laser to do the same as TUMT and TUNA.  Transurethral electrovaporization (TUVP)--This is a procedure that uses electrodes to do the same as the procedures listed above. SEEK MEDICAL CARE IF:   You develop a fever.  There is unexplained back pain.  Symptoms are not helped by medicines prescribed.  You develop side effects from the medicine you are taking.  Your urine becomes very dark or has a bad smell.  Your lower abdomen becomes distended and you have difficulty passing your urine. SEEK IMMEDIATE MEDICAL CARE IF:   You are suddenly unable to urinate. This is an emergency. You should be seen immediately.  There are large amounts of blood or clots in the urine.  Your urinary problems become unmanageable.  You develop  lightheadedness, severe dizziness, or you feel faint.  You develop moderate to severe low back or flank pain.  You develop chills or fever. Document Released: 01/10/2005 Document Revised: 01/15/2013 Document Reviewed: 07/26/2012 Exit[employer] Patient Information 2015 [employer]. This information is not intended to replace advice given to you by your health care provider. Make sure you discuss any questions you have with your health care provider.  

## 2013-09-13 ENCOUNTER — Other Ambulatory Visit: Payer: Medicare Other

## 2013-09-13 DIAGNOSIS — N400 Enlarged prostate without lower urinary tract symptoms: Secondary | ICD-10-CM | POA: Diagnosis not present

## 2013-09-13 DIAGNOSIS — E785 Hyperlipidemia, unspecified: Secondary | ICD-10-CM

## 2013-09-14 LAB — LIPID PANEL
CHOL/HDL RATIO: 2.5 ratio (ref 0.0–5.0)
Cholesterol, Total: 186 mg/dL (ref 100–199)
HDL: 75 mg/dL (ref >39)
LDL CALC: 98 mg/dL (ref 0–99)
TRIGLYCERIDES: 64 mg/dL (ref 0–149)
VLDL CHOLESTEROL CAL: 13 mg/dL (ref 5–40)

## 2013-09-17 ENCOUNTER — Encounter: Payer: Self-pay | Admitting: Internal Medicine

## 2013-09-17 ENCOUNTER — Ambulatory Visit: Payer: Medicare Other | Admitting: Internal Medicine

## 2013-09-17 ENCOUNTER — Ambulatory Visit (INDEPENDENT_AMBULATORY_CARE_PROVIDER_SITE_OTHER): Payer: Medicare Other | Admitting: Internal Medicine

## 2013-09-17 VITALS — BP 110/70 | HR 59 | Temp 98.0°F | Ht 63.5 in | Wt 128.0 lb

## 2013-09-17 DIAGNOSIS — N400 Enlarged prostate without lower urinary tract symptoms: Secondary | ICD-10-CM | POA: Diagnosis not present

## 2013-09-17 DIAGNOSIS — E785 Hyperlipidemia, unspecified: Secondary | ICD-10-CM

## 2013-09-17 DIAGNOSIS — R209 Unspecified disturbances of skin sensation: Secondary | ICD-10-CM | POA: Diagnosis not present

## 2013-09-17 DIAGNOSIS — R300 Dysuria: Secondary | ICD-10-CM | POA: Diagnosis not present

## 2013-09-17 DIAGNOSIS — R200 Anesthesia of skin: Secondary | ICD-10-CM | POA: Insufficient documentation

## 2013-09-17 MED ORDER — SULFAMETHOXAZOLE-TMP DS 800-160 MG PO TABS: 1.0000 | ORAL_TABLET | Freq: Two times a day (BID) | ORAL | Status: DC

## 2013-09-17 MED ORDER — PHENAZOPYRIDINE HCL 100 MG PO TABS: 100.0000 mg | ORAL_TABLET | Freq: Three times a day (TID) | ORAL | Status: DC | PRN
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
## 2013-09-18 LAB — URINALYSIS, ROUTINE W REFLEX MICROSCOPIC
BILIRUBIN UA: NEGATIVE
GLUCOSE, UA: NEGATIVE
KETONES UA: NEGATIVE
Leukocytes, UA: NEGATIVE
Nitrite, UA: NEGATIVE
Protein, UA: NEGATIVE
RBC, UA: NEGATIVE
Specific Gravity, UA: 1.023 (ref 1.005–1.030)
Urobilinogen, Ur: 0.2 mg/dL (ref 0.0–1.9)
pH, UA: 5.5 (ref 5.0–7.5)

## 2013-09-18 LAB — PSA: PSA: 3 ng/mL (ref 0.0–4.0)

## 2013-09-18 LAB — SPECIMEN STATUS REPORT

## 2013-09-19 LAB — URINE CULTURE: Organism ID, Bacteria: NO GROWTH

## 2014-01-09 ENCOUNTER — Ambulatory Visit: Payer: Medicare Other | Admitting: Nurse Practitioner

## 2014-01-27 DIAGNOSIS — J111 Influenza due to unidentified influenza virus with other respiratory manifestations: Secondary | ICD-10-CM | POA: Diagnosis not present

## 2014-01-27 DIAGNOSIS — J40 Bronchitis, not specified as acute or chronic: Secondary | ICD-10-CM | POA: Diagnosis not present

## 2014-02-06 ENCOUNTER — Encounter: Payer: Self-pay | Admitting: Nurse Practitioner

## 2014-02-06 ENCOUNTER — Ambulatory Visit (INDEPENDENT_AMBULATORY_CARE_PROVIDER_SITE_OTHER): Payer: Medicare Other | Admitting: Nurse Practitioner

## 2014-02-06 VITALS — BP 112/70 | HR 66 | Temp 98.0°F | Resp 18 | Ht 63.5 in | Wt 131.6 lb

## 2014-02-06 DIAGNOSIS — J019 Acute sinusitis, unspecified: Secondary | ICD-10-CM

## 2014-02-06 DIAGNOSIS — R42 Dizziness and giddiness: Secondary | ICD-10-CM

## 2014-02-06 DIAGNOSIS — N400 Enlarged prostate without lower urinary tract symptoms: Secondary | ICD-10-CM | POA: Diagnosis not present

## 2014-02-06 MED ORDER — TAMSULOSIN HCL 0.4 MG PO CAPS: 0.4000 mg | ORAL_CAPSULE | Freq: Every day | ORAL | Status: DC

## 2014-02-06 MED ORDER — AMOXICILLIN-POT CLAVULANATE 875-125 MG PO TABS: 1.0000 | ORAL_TABLET | Freq: Two times a day (BID) | ORAL | Status: DC

## 2014-02-06 NOTE — Patient Instructions (Signed)
Sinusitis Neti Pot twice daily - over the counter near the pharmacy section at the drug store Augmentin (antibiotic) twice daily until finished  BPH- Will restart flomax Please daily daily for 4 months and follow up at that time  Dizziness and brain tumor Increase dizziness may be related to sinusitis however you need to follow up with neurology, will send for referral     Benign Prostatic Hyperplasia An enlarged prostate (benign prostatic hyperplasia) is common in older men. You may experience the following:  Weak urine stream.  Dribbling.  Feeling like the bladder has not emptied completely.  Difficulty starting urination.  Getting up frequently at night to urinate.  Urinating more frequently during the day. HOME CARE INSTRUCTIONS  Monitor your prostatic hyperplasia for any changes. The following actions may help to alleviate any discomfort you are experiencing:  Give yourself time when you urinate.  Stay away from alcohol.  Avoid beverages containing caffeine, such as coffee, tea, and colas, because they can make the problem worse.  Avoid decongestants, antihistamines, and some prescription medicines that can make the problem worse.  Follow up with your health care provider for further treatment as recommended. SEEK MEDICAL CARE IF:  You are experiencing progressive difficulty voiding.  Your urine stream is progressively getting narrower.  You are awaking from sleep with the urge to void more frequently.  You are constantly feeling the need to void.  You experience loss of urine, especially in small amounts. SEEK IMMEDIATE MEDICAL CARE IF:   You develop increased pain with urination or are unable to urinate.  You develop severe abdominal pain, vomiting, a high fever, or fainting.  You develop back pain or blood in your urine. MAKE SURE YOU:   Understand these instructions.  Will watch your condition.  Will get help right away if you are not doing well  or get worse. Document Released: 01/10/2005 Document Revised: 09/12/2012 Document Reviewed: 06/12/2012 [facility] CenterExitCare Patient Information 2015 [place]Care, [place]. This information is not intended to replace advice given to you by your health care provider. Make sure you discuss any questions you have with your health care provider.
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
## 2014-02-17 ENCOUNTER — Ambulatory Visit (INDEPENDENT_AMBULATORY_CARE_PROVIDER_SITE_OTHER): Payer: Medicare Other | Admitting: Diagnostic Neuroimaging

## 2014-02-17 ENCOUNTER — Encounter: Payer: Self-pay | Admitting: Diagnostic Neuroimaging

## 2014-02-17 VITALS — BP 130/69 | HR 62 | Temp 98.0°F | Ht 63.0 in | Wt 130.4 lb

## 2014-02-17 DIAGNOSIS — G20 Parkinson's disease: Secondary | ICD-10-CM | POA: Diagnosis not present

## 2014-02-17 DIAGNOSIS — R251 Tremor, unspecified: Secondary | ICD-10-CM

## 2014-02-17 DIAGNOSIS — R259 Unspecified abnormal involuntary movements: Secondary | ICD-10-CM | POA: Diagnosis not present

## 2014-02-17 DIAGNOSIS — Z79899 Other long term (current) drug therapy: Secondary | ICD-10-CM | POA: Diagnosis not present

## 2014-02-17 DIAGNOSIS — G20C Parkinsonism, unspecified: Secondary | ICD-10-CM

## 2014-02-17 DIAGNOSIS — R269 Unspecified abnormalities of gait and mobility: Secondary | ICD-10-CM

## 2014-02-17 DIAGNOSIS — R258 Other abnormal involuntary movements: Secondary | ICD-10-CM | POA: Diagnosis not present

## 2014-02-17 NOTE — Patient Instructions (Signed)
I will check additional testing and refer you to physical therapy.

## 2014-02-17 NOTE — Progress Notes (Signed)
[facility]  PATIENT: Alexander Bass DOB: 1945-09-12  REFERRING CLINICIAN: Janyth ContesEubanks HISTORY FROM: patient  REASON FOR VISIT: new consult    HISTORICAL  CHIEF COMPLAINT:  Chief Complaint  Patient presents with  . New Evaluation    dizziness    HISTORY OF PRESENT ILLNESS:   69 year old right-handed male here for evaluation of balance/dizziness problem for past 1 month. Patient is noted intermittent episodes of staggering, slightly losing balance towards right side. This happens once per day. Symptoms are very brief lasting only a second or 2. No other associated symptoms.  In 2010 patient was having some sinus/nose issues, seeing ENT specialist, who ordered imaging study. 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 Patient had resection, apparently through transsphenoidal approach, without any other issues. [place].  Patient has also noted some mild intermittent tremor in his left thumb. Patient feels he is slowing down generally with his movements as well as with his strength.  Patient reports a sister with Parkinson's disease, diagnosed age 70-70 years old and deceased at age 70 years old. Patient's sister was 10 years older than patient.    REVIEW OF SYSTEMS: Full 14 system review of systems performed and notable only for fatigue cough snoring hearing loss itching impotence runny nose anxiety dizziness headache numbness weakness snoring.  ALLERGIES: No Known Allergies  HOME MEDICATIONS: Outpatient Prescriptions Prior to Visit  Medication Sig Dispense Refill  . NON FORMULARY Vitamin Maka (Puru vitamin) 1 tablet daily  As needed.    . tamsulosin (FLOMAX) 0.4 MG CAPS capsule Take 1 capsule (0.4 mg total) by mouth daily. 30 capsule 3  . vitamin E 400 UNIT capsule Take 400 Units by mouth daily.    Marland Kitchen. amoxicillin-clavulanate (AUGMENTIN) 875-125 MG per tablet Take 1 tablet by mouth 2 (two) times  daily. 20 tablet 0   No facility-administered medications prior to visit.    PAST MEDICAL HISTORY: No past medical history on file.  PAST SURGICAL HISTORY: Past Surgical History  Procedure Laterality Date  . 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 Inguinal hernia repair Right 2008    FAMILY HISTORY: Family History  Problem Relation Age of Onset  . Cancer Mother   . Heart attack Father   . Heart disease Brother     SOCIAL HISTORY:  History   Social History  . Marital Status: Divorced    Spouse Name: N/A    Number of Children: 3  . Years of Education: 12th   Occupational History  . retired     Social History Main Topics  . Smoking status: Former Smoker -- 0.25 packs/day for 20 years    Quit date: 01/24/1998  . Smokeless tobacco: Never Used  . Alcohol Use: No     Comment: glass of wine occasionally. Stopped  . Drug Use: No  . Sexual Activity: Yes   Other Topics Concern  . Not on file   Social History Narrative   Exercise walks 5 days a week   Stopped smoking 2001   Stopped drinking alochol           PHYSICAL EXAM  Filed Vitals:   02/17/14 1020  BP: 130/69  Pulse: 62  Temp: 98 F (36.7 C)  TempSrc: Oral  Height: 5\' 3"  (1.6 m)  Weight: 130 lb 6.4 oz (59.149 kg)    Body mass index is 23.11  kg/(m^2).   Visual Acuity Screening   Right eye Left eye Both eyes  Without correction: 20/40 20/40   With correction:       MMSE - Mini Mental State Exam 06/20/2013  Orientation to time 5  Orientation to Place 5  Registration 3  Attention/ Calculation 5  Recall 3  Language- name 2 objects 2  Language- repeat 1  Language- follow 3 step command 3  Language- read & follow direction 1  Write a sentence 1  Copy design 1  Total score 30    GENERAL EXAM: Patient is in no distress; well developed, nourished and groomed; neck is supple; MASKED FACIES; POSITIVE PALMOMENTAL; NEG SNOUT AND MYERSONS.  CARDIOVASCULAR: Regular rate  and rhythm, no murmurs, no carotid bruits  NEUROLOGIC: MENTAL STATUS: awake, alert, oriented to person, place and time, recent and remote memory intact, normal attention and concentration, language fluent, comprehension intact, naming intact, fund of knowledge appropriate CRANIAL NERVE: no papilledema on fundoscopic exam, pupils equal and reactive to light, visual fields full to confrontation, extraocular muscles intact, no nystagmus, facial sensation and strength symmetric, hearing intact, palate elevates symmetrically, uvula midline, shoulder shrug symmetric, tongue midline. MOTOR: normal bulk; MILD COGWHEELING IN LUE; MILD BRADYKINESIA IN BUE (LEFT SLOWER THAN RIGHT); full strength in the BUE, BLE SENSORY: normal and symmetric to light touch, temperature, vibration COORDINATION: finger-nose-finger, fine finger movements normal REFLEXES: deep tendon reflexes present and symmetric; ABSENT AT ANKLES GAIT/STATION: narrow based gait; SLOW CAUTIOUS GAIT; SLOW TURN; CANNOT HEEL OR TANDEM WALK; romberg is negative    DIAGNOSTIC DATA (LABS, IMAGING, TESTING) - I reviewed patient records, labs, notes, testing and imaging myself where available.  Lab Results  Component Value Date   WBC 4.9 05/16/2013   HGB 14.6 05/16/2013   HCT 42.6 05/16/2013   MCV 88 05/16/2013   PLT 311 05/16/2013      Component Value Date/Time   NA 143 05/16/2013 1053   K 4.4 05/16/2013 1053   CL 105 05/16/2013 1053   CO2 22 05/16/2013 1053   GLUCOSE 81 05/16/2013 1053   BUN 12 05/16/2013 1053   CREATININE 0.93 05/16/2013 1053   CALCIUM 9.0 05/16/2013 1053   PROT 7.2 05/16/2013 1053   AST 22 05/16/2013 1053   ALT 11 05/16/2013 1053   ALKPHOS 87 05/16/2013 1053   BILITOT 0.4 05/16/2013 1053   GFRNONAA 83 05/16/2013 1053   GFRAA 96 05/16/2013 1053   Lab Results  Component Value Date   HDL 75 09/13/2013   LDLCALC 98 09/13/2013   TRIG 64 09/13/2013   CHOLHDL 2.5 09/13/2013   No results found for: HGBA1C No  results found for: VITAMINB12 Lab Results  Component Value Date   TSH 2.290 05/16/2013       ASSESSMENT AND PLAN  69 y.o. year old male here with 1 month history of mild intermittent balance difficulties. On exam patient has some mild Parkinson's signs, as well as neuropathy versus lumbar radiculopathy signs. Patient has history of apparent brain tumor/cyst resection, transsphenoidal approach, 2010. We'll check MRI to rule out any other secondary brain causes.  Ddx: parkinsonism, neuropathy, CNS structural  PLAN: - additional workup - PT evaluation for balance/gait exercises and training  Orders Placed This Encounter  Procedures  . MR Brain W Wo Contrast  . Vitamin B12  . TSH  . Hemoglobin A1c  . Ambulatory referral to Physical Therapy   Return in about 6 weeks (around 03/31/2014).    Suanne Marker, MD 02/17/2014, 11:06  AM Certified in Neurology, Neurophysiology and [facility] [address] [place], [place] [address] [phone]

## 2014-02-18 LAB — VITAMIN B12: VITAMIN B 12: 674 pg/mL (ref 211–946)

## 2014-02-18 LAB — HEMOGLOBIN A1C
ESTIMATED AVERAGE GLUCOSE: 117 mg/dL
HEMOGLOBIN A1C: 5.7 % — AB (ref 4.8–5.6)

## 2014-02-18 LAB — TSH: TSH: 1.82 u[IU]/mL (ref 0.450–4.500)

## 2014-02-27 ENCOUNTER — Telehealth: Payer: Self-pay | Admitting: *Deleted

## 2014-02-27 NOTE — Telephone Encounter (Addendum)
Spoke with the pt's son on the phone. Explained that his recent lab work showed showed a Hgb A1C of 5.7. He asked what that meant. I explained that it technically meant prediabetes and that if it was left uncontrolled he could potentially become a type two diabetic. His son asked me what he needed to do to prevent him from becoming a type two diabetic. Explained that he should increase his water intake, more fruits and vegetables, more exercise where he was able and no soda, no candy, no fast food. Son stated an understanding.

## 2014-03-04 ENCOUNTER — Ambulatory Visit
Admission: RE | Admit: 2014-03-04 | Discharge: 2014-03-04 | Disposition: A | Discharge status: Home / Self Care | Payer: Medicare Other | Source: Ambulatory Visit | Attending: Diagnostic Neuroimaging | Admitting: Diagnostic Neuroimaging

## 2014-03-04 DIAGNOSIS — R258 Other abnormal involuntary movements: Secondary | ICD-10-CM

## 2014-03-04 DIAGNOSIS — R251 Tremor, unspecified: Secondary | ICD-10-CM | POA: Diagnosis not present

## 2014-03-04 DIAGNOSIS — G20 Parkinson's disease: Secondary | ICD-10-CM

## 2014-03-04 DIAGNOSIS — R269 Unspecified abnormalities of gait and mobility: Secondary | ICD-10-CM | POA: Diagnosis not present

## 2014-03-04 MED ORDER — GADOBENATE DIMEGLUMINE 529 MG/ML IV SOLN
12.0000 mL | Freq: Once | INTRAVENOUS | Status: AC | PRN
  Administered 2014-03-04: 12 mL via INTRAVENOUS

## 2014-03-10 ENCOUNTER — Ambulatory Visit: Payer: Medicare Other | Admitting: Physical Therapy

## 2014-03-13 ENCOUNTER — Ambulatory Visit: Payer: Medicare Other | Attending: Diagnostic Neuroimaging | Admitting: Physical Therapy

## 2014-03-13 DIAGNOSIS — R293 Abnormal posture: Secondary | ICD-10-CM

## 2014-03-13 DIAGNOSIS — G20 Parkinson's disease: Secondary | ICD-10-CM | POA: Diagnosis not present

## 2014-03-13 DIAGNOSIS — R269 Unspecified abnormalities of gait and mobility: Secondary | ICD-10-CM | POA: Diagnosis not present

## 2014-03-13 DIAGNOSIS — M6281 Muscle weakness (generalized): Secondary | ICD-10-CM | POA: Diagnosis not present

## 2014-03-14 NOTE — Therapy (Signed)
[facility] [address] [place], [place], [address] Phone: [phone]   Fax:  [phone]  Physical Therapy Evaluation  Patient Details  Name: Alexander Bass MRN: [id number] Date of Birth: 12-30-1945 Referring Provider:  Suanne Marker, MD  Encounter Date: 03/13/2014      PT End of Session - 03/14/14 0754    Visit Number 1   Number of Visits 9   Date for PT Re-Evaluation 05/11/14   Authorization Type Medicare   PT Start Time 0934   PT Stop Time 1015   PT Time Calculation (min) 41 min   Activity Tolerance Patient tolerated treatment well      No past medical history on file.  Past Surgical History  Procedure Laterality Date  . 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 Inguinal hernia repair Right 2008    There were no vitals taken for this visit.  Visit Diagnosis:  Abnormality of gait  Muscle weakness of lower extremity  Postural instability      Subjective Assessment - 03/13/14 0941    Symptoms Pt is a 69 year old male who presents to OP PT with Parkinsonism.  He reports some dizziness, balance issues for the past 3 months.  He has pituitary gland surgery in 2010; he has had MRI, with no results yet.  Neurologist questioning Parkinson's disease.  Pt notices some tremor of L thumb.  Pt  reports occasional difficulty with getting up from chair after prolonged sitting.  Pt has had  1 fall in the past 6 months, which occurred during the first snow/ice event of this winter, approx, 1 month ago.   Patient Stated Goals Pt wants to be able to do exercises.   Currently in Pain? Yes   Pain Score 3    Pain Location Head   Pain Descriptors / Indicators Aching   Pain Type Acute pain   Pain Onset 1 to 4 weeks ago   Pain Frequency Several days a week   Aggravating Factors  unsure   Pain Relieving Factors goes away on its own                                 PT  Long Term Goals - 03/14/14 0910    PT LONG TERM GOAL #1   Title Pt will be independent with HEP for improved balance, strength, mobility.   Time 4   Period Weeks   Status New   PT LONG TERM GOAL #2   Title Pt will improve Functional Gait Assessment to at least 22/30 for decreased fall risk.   Time 4   Period Weeks   Status New   PT LONG TERM GOAL #3   Title Pt will improve TUG cognitive score to less than or equal to 15 seconds for decreased fall risk.   Time 4   Period Weeks   Status New   PT LONG TERM GOAL #4   Title Pt will improve 5x sit<>stand to less than or equal to 15 seconds for improved transfer efficiency and safety.   Time 4   Period Weeks   Status New   PT LONG TERM GOAL #5   Title Pt will verbalize plans for continued community fitness upon D/C from PT.   Time 4   Period Weeks   Status New  Plan - 03/14/14 0755    Clinical Impression Statement Pt is a 69 year old male who presents to OP PT with reports of slowed movement patterns and occasional L thumb tremor, whose neurologist is suspecting Parkinsonism.  He presents with overall slowed movement pattern and is having a hard time starting exercise program.  He has had one fall on the ice in January.  He does present at fall risk due to Functional GAit Assessment score of 20/30, Timed Up and Go 14.37 sec, TUG cognitive score of 16.32 and 5x sit<>stand score of 18.8 seconds.  Pt would benefit from skilled PT to initiate HEP for large amplitude movements and to work on balance and gait as well as lower extemity strength.   Pt will benefit from skilled therapeutic intervention in order to improve on the following deficits Abnormal gait;Decreased endurance;Decreased strength;Decreased mobility;Decreased balance   Rehab Potential Good   PT Frequency 2x / week   PT Duration 4 weeks  Recommended; however, pt waiting to schedule to speak with insurance coordinator due to financial concerns   PT  Treatment/Interventions ADLs/Self Care Home Management;Therapeutic activities;Therapeutic exercise;Balance training;Gait training;Neuromuscular re-education;Functional mobility training;Patient/family education   PT Next Visit Plan Initiate HEP, balance, strengthening   PT Home Exercise Plan PWR! exercises   Consulted and Agree with Plan of Care Patient          G-Codes - 03/14/14 0920    Functional Assessment Tool Used Functional Gait Assessment 20/30, TUG 14.37 sec, TUG cognitive 16.32 sec, 5x sit<>stand 18.8 seconds   Functional Limitation Mobility: Walking and moving around   Mobility: Walking and Moving Around Current Status [phone]([id number]) At least 20 percent but less than 40 percent impaired, limited or restricted       Problem List Patient Active Problem List   Diagnosis Date Noted  . Other and unspecified hyperlipidemia 09/17/2013  . BPH (benign prostatic hyperplasia) 09/17/2013  . Numbness in right leg 09/17/2013    Alexander Stephens W. 03/14/2014, 9:21 AM  Lonia BloodAmy Nihira Bass, PT 03/14/2014 9:21 AM Phone: [phone] Fax: [phone]   [facility] [address] [place], [place], [address] Phone: [phone]   Fax:  [phone]

## 2014-03-19 ENCOUNTER — Ambulatory Visit: Payer: Medicare Other | Admitting: Physical Therapy

## 2014-03-19 DIAGNOSIS — G20 Parkinson's disease: Secondary | ICD-10-CM | POA: Diagnosis not present

## 2014-03-19 DIAGNOSIS — R269 Unspecified abnormalities of gait and mobility: Secondary | ICD-10-CM | POA: Diagnosis not present

## 2014-03-19 DIAGNOSIS — R293 Abnormal posture: Secondary | ICD-10-CM

## 2014-03-19 DIAGNOSIS — M6281 Muscle weakness (generalized): Secondary | ICD-10-CM

## 2014-03-19 NOTE — Therapy (Signed)
[facility] [address] [place], [place], [address] Phone: [phone]   Fax:  [phone]  Physical Therapy Treatment  Patient Details  Name: Alexander JewelJose U Leiphart MRN: [id number] Date of Birth: 02-Jun-1945 Referring Provider:  Sharon SellerEubanks, Jessica K, NP  Encounter Date: 03/19/2014      PT End of Session - 03/19/14 1826    Visit Number 2   Number of Visits 9   Date for PT Re-Evaluation 05/11/14   Authorization Type Medicare-G-code every 10th visit   PT Start Time 1023   PT Stop Time 1101   PT Time Calculation (min) 38 min   Activity Tolerance Patient tolerated treatment well      No past medical history on file.  Past Surgical History  Procedure Laterality Date  . 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 Inguinal hernia repair Right 2008    There were no vitals taken for this visit.  Visit Diagnosis:  Muscle weakness of lower extremity  Postural instability      Subjective Assessment - 03/19/14 1025    Symptoms No changes, no falls   Currently in Pain? No/denies                    [facility] Adult PT Treatment/Exercise - 03/19/14 0001    Exercises   Exercises Knee/Hip   Knee/Hip Exercises: Aerobic   Stationary Bike Nustep, 4 extremities x 6:30 at Level 5, steps per minute up to 91 for improved strength and lower extremity flexibility      In corner:  Heel/toe raises x 20 reps, then wall bumps for hip strategy x 20 reps.       [facility] - 03/19/14 1031    PWR! exercises Moves in sitting;Moves in standing   PWR! Up 20   PWR! Rock 20   PWR! Twist 20   PWR Step 20   Comments forward then backward step and ewightshift x 20 reps   PWR! Up 20   PWR! Rock 20   PWR! Twist 20   PWR! Step 20      PWR! Up performed for posture, PWR! Rock for Verizonweightshifting, PWR! Twist for trunk rotation, and PWR! Step for improved step amplitude.  PWR! Moves exercises initiated to address large  amplitude movement patterns.        PT Education - 03/19/14 1824    Education provided Yes   Education Details HEP-PWR! Moves in standing; hip and ankle strategy exercises at corner   Person(s) Educated Patient   Methods Explanation;Demonstration;Handout   Comprehension Verbalized understanding;Returned demonstration             PT Long Term Goals - 03/14/14 0910    PT LONG TERM GOAL #1   Title Pt will be independent with HEP for improved balance, strength, mobility.   Time 4   Period Weeks   Status New   PT LONG TERM GOAL #2   Title Pt will improve Functional Gait Assessment to at least 22/30 for decreased fall risk.   Time 4   Period Weeks   Status New   PT LONG TERM GOAL #3   Title Pt will improve TUG cognitive score to less than or equal to 15 seconds for decreased fall risk.   Time 4   Period Weeks   Status New   PT LONG TERM GOAL #4   Title Pt will improve 5x sit<>stand to less than or  equal to 15 seconds for improved transfer efficiency and safety.   Time 4   Period Weeks   Status New   PT LONG TERM GOAL #5   Title Pt will verbalize plans for continued community fitness upon D/C from PT.   Time 4   Period Weeks   Status New               Plan - 03/19/14 1826    Clinical Impression Statement HEP initiated today for large amplitude movement exercises as well as hip/ankle strategy ankle exercises.  Pt performs and sequences exercises well.  Pt will continue to benefit from further skilled PT to address balance and gait and lower extremity strength.    Pt will benefit from skilled therapeutic intervention in order to improve on the following deficits Abnormal gait;Decreased endurance;Decreased strength;Decreased mobility;Decreased balance   Rehab Potential Good   PT Frequency 2x / week   PT Duration 4 weeks  wk 1 of 4   PT Treatment/Interventions ADLs/Self Care Home Management;Therapeutic activities;Therapeutic exercise;Balance training;Gait  training;Neuromuscular re-education;Functional mobility training;Patient/family education   PT Next Visit Plan Review HEP, balance and strengthening exercises   Consulted and Agree with Plan of Care Patient        Problem List Patient Active Problem List   Diagnosis Date Noted  . Other and unspecified hyperlipidemia 09/17/2013  . BPH (benign prostatic hyperplasia) 09/17/2013  . Numbness in right leg 09/17/2013    Keric Zehren W. 03/19/2014, 6:29 PM  Lonia Blood, PT 03/19/2014 6:30 PM Phone: [phone] Fax: [phone]   [facility] [address] [place], [place], [address] Phone: [phone]   Fax:  [phone]

## 2014-03-19 NOTE — Patient Instructions (Signed)
Weight Shift: Anterior / Posterior (Limits of Stability)   Slowly shift weight backward until toes begin to rise off floor. Return to starting position. Shift weight slowly forward until heels begin to rise off floor. Hold each position _3___ seconds. Repeat _20___ times per session. Do _1-2___ sessions per day.   Copyright  VHI. All rights reserved.  Weight Shift: Anterior / Posterior (Righting / Equilibrium)  (WALL BUMPS)    Shift weight backward, bending at your hips, to let hips touch wall, then pull forward and tall. Hold each position __3__ seconds. Repeat _20___ times per session. Do __1-2__ sessions per day.  Copyright  VHI. All rights reserved.   Instructions given for PWR! Moves Exercises in standing

## 2014-03-21 ENCOUNTER — Ambulatory Visit: Payer: Medicare Other | Admitting: Physical Therapy

## 2014-03-21 DIAGNOSIS — R269 Unspecified abnormalities of gait and mobility: Secondary | ICD-10-CM

## 2014-03-21 DIAGNOSIS — M6281 Muscle weakness (generalized): Secondary | ICD-10-CM | POA: Diagnosis not present

## 2014-03-21 DIAGNOSIS — G20 Parkinson's disease: Secondary | ICD-10-CM | POA: Diagnosis not present

## 2014-03-21 NOTE — Therapy (Signed)
[facility] [address] [place], [place], [address] Phone: [phone]   Fax:  [phone]  Physical Therapy Treatment  Patient Details  Name: Alexander Bass MRN: [id number] Date of Birth: 10-11-1945 Referring Provider:  Sharon Seller, NP  Encounter Date: 03/21/2014      PT End of Session - 03/21/14 1204    Visit Number 3   Number of Visits 9   Date for PT Re-Evaluation 05/11/14   Authorization Type Medicare-G-code every 10th visit   PT Start Time 1107   PT Stop Time 1143   PT Time Calculation (min) 36 min   Activity Tolerance Patient tolerated treatment well      No past medical history on file.  Past Surgical History  Procedure Laterality Date  . 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 Inguinal hernia repair Right 2008    There were no vitals taken for this visit.  Visit Diagnosis:  Abnormality of gait  Muscle weakness of lower extremity      Subjective Assessment - 03/21/14 1108    Symptoms Had a little pain in left hip/low back with exercises yesterday; no pain today   Currently in Pain? No/denies                    [facility] Adult PT Treatment/Exercise - 03/21/14 0001    High Level Balance   High Level Balance Activities Side stepping;Braiding;Tandem walking;Marching forwards;Marching backwards  tandem march, cross over steps each 25 ft x 2   High Level Balance Comments Heel/toe raises x 20 reps   Exercises   Exercises Knee/Hip   Knee/Hip Exercises: Aerobic   Stationary Bike Scifit, Level 2, 4 extremities x 8:30 at RPm 70-80 for leg strength and flexibility           [facility]) - 03/21/14 1109    PWR! exercises Moves in standing   PWR! Up 20  Review of PWR! Standing Moves HEP-pt independent   PWR! Rock 20   PWR! Twist 20   PWR Step 20   Comments forward step and weightshift; back step and weightshift x 10 reps each             PT Education  - 03/21/14 1203    Education provided Yes   Education Details HEP-balance exercises  attempted to have exercises print in spanish-not able   Person(s) Educated Patient   Methods Explanation;Demonstration;Handout   Comprehension Verbalized understanding;Returned demonstration             PT Long Term Goals - 03/14/14 0910    PT LONG TERM GOAL #1   Title Pt will be independent with HEP for improved balance, strength, mobility.   Time 4   Period Weeks   Status New   PT LONG TERM GOAL #2   Title Pt will improve Functional Gait Assessment to at least 22/30 for decreased fall risk.   Time 4   Period Weeks   Status New   PT LONG TERM GOAL #3   Title Pt will improve TUG cognitive score to less than or equal to 15 seconds for decreased fall risk.   Time 4   Period Weeks   Status New   PT LONG TERM GOAL #4   Title Pt will improve 5x sit<>stand to less than or equal to 15 seconds for improved transfer efficiency and safety.   Time 4   Period Weeks  Status New   PT LONG TERM GOAL #5   Title Pt will verbalize plans for continued community fitness upon D/C from PT.   Time 4   Period Weeks   Status New              Plan - 03/21/14 1205    Clinical Impression Statement Needs cues for exercise technique for posterior step and weightshift exercises, due to pt taking too long of a step, potentially causing slight pain in L low back/hip area.  Even with cueing pt has difficulty managing correct technique with that exercise.  Pt performs other exercises well and safely.   Pt will benefit from skilled therapeutic intervention in order to improve on the following deficits Abnormal gait;Decreased endurance;Decreased strength;Decreased mobility;Decreased balance   Rehab Potential Good   PT Frequency 2x / week   PT Duration 4 weeks  wk 1 of 4   PT Treatment/Interventions ADLs/Self Care Home Management;Therapeutic activities;Therapeutic exercise;Balance training;Gait  training;Neuromuscular re-education;Functional mobility training;Patient/family education   PT Next Visit Plan Review HEP; try wall squats and forward step ups; discuss community fitness   Consulted and Agree with Plan of Care Patient           Problem List Patient Active Problem List   Diagnosis Date Noted  . Other and unspecified hyperlipidemia 09/17/2013  . BPH (benign prostatic hyperplasia) 09/17/2013  . Numbness in right leg 09/17/2013    MARRIOTT,AMY W. 03/21/2014, 12:11 PM Lonia BloodAmy Marriott, PT 03/21/2014 12:12 PM Phone: [phone] Fax: [phone]  [facility] [address] [place], [place], [address] Phone: [phone]   Fax:  [phone]

## 2014-03-21 NOTE — Patient Instructions (Signed)
Side-Stepping   Walk to left side with eyes open. Take even steps, leading with same foot. Make sure each foot lifts off the floor. Repeat in opposite direction. Repeat for __20 ft__  . Repeat  3 times each direction. Copyright  VHI. All rights reserved.    Feet Heel-Toe "Tandem"   Arms outstretched, walk a straight line bringing one foot directly in front of the other. Repeat for _20___  Feet.  Repeat 3 times.  Make sure to look ahead at a target and try to relax arms by your side.  Copyright  VHI. All rights reserved.  Crossovers   Move to side: 1) cross right leg in front, then 2) bring back leg out to side. Repeat sequence in same direction. Reverse sequence, moving in opposite direction. Repeat sequence __20 feet__, 3 times per session. .  Repeat on compliant surface: ________.  Copyright  VHI. All rights reserved.  Braiding   Move to side: 1) cross right leg in front of left, 2) bring back leg out to side, then 3) cross right leg behind left, 4) bring left leg out to side. Continue sequence in same direction. Reverse sequence, moving in opposite direction. Repeat sequence _20 ft, 3___ times per session.   Copyright  VHI. All rights reserved.

## 2014-03-25 ENCOUNTER — Ambulatory Visit: Payer: Medicare Other | Attending: Diagnostic Neuroimaging | Admitting: Physical Therapy

## 2014-03-25 DIAGNOSIS — G20 Parkinson's disease: Secondary | ICD-10-CM | POA: Diagnosis not present

## 2014-03-25 DIAGNOSIS — R269 Unspecified abnormalities of gait and mobility: Secondary | ICD-10-CM | POA: Diagnosis not present

## 2014-03-25 DIAGNOSIS — R293 Abnormal posture: Secondary | ICD-10-CM

## 2014-03-25 DIAGNOSIS — M6281 Muscle weakness (generalized): Secondary | ICD-10-CM | POA: Diagnosis not present

## 2014-03-25 NOTE — Patient Instructions (Addendum)
Strengthening: Wall Slide   Leaning on wall, slowly lower buttocks until thighs are parallel to floor. Hold ____ seconds. Tighten thigh muscles and return. Repeat ____ times per set. Do ____ sets per session. Do ____ sessions per day.  http://orth.exer.us/630   Copyright  VHI. All rights reserved.  Strengthening: Wall Slide   Leaning on wall, slowly lower buttocks into a gentle squat. Hold __3__ seconds. Tighten thigh muscles and return to standing. Repeat __10__ times per set. Do __2__ sets per session. Do _1-2___ sessions per day.  http://orth.exer.us/630   Copyright  VHI. All rights reserved.  Community Occupational psychologist of Services Cost  A Matter of Balance Class locations vary. Call [place] on Aging for more information.  http://dawson-may.com/ [phone] 8-Session program addressing the fear of falling and increasing activity levels of older adults Free to minimal cost  A.C.T. By [employer] [address] [place], [place] [address].  BetaBlues.dk [phone]  Personal training, gym, classes including Silver Sneakers* and ACTion for Aging Adults Fee-based  A.H.O.Y. (Add Health to [place]) Airs on Time [employer] 13, M-F at [place]: [employer],  [place] [facility] [place],  [place], [place] [facility], [address] [facility], [place], [place], [place] [address]  [place] Location: Sharrell Ku. [employer] [place]ley Amo      [phone]  [phone]  [phone]  [phone]  [phone]  [phone]  [phone]  [phone]  [phone]  [phone]    [phone] A total-body  conditioning class for adults 58 and older; designed to increase muscular strength, endurance, range of movement, flexibility, balance, agility and coordination Free  [facility] [place], [place] [address] [place]      [address]      [phone]      Pilate's class for individualsreturning to exercise after an injury, before or after surgery or for individuals with complex musculoskeletal issues; designed to improve strength, balance , flexibility      $15/class  [place] [address] [place], [place] [address] www.CreditChaos.dk [place] classes for beginners to advanced [place], [place] [address] Seniorcenter_0 -resources-[email or web address].org www.[email or web address]-[email or web address]-[email or web address]/sr.center.cfm [place] Chair Exercises Free, ages 50 and older; Ages 30-59 fee based  Marvia Pickles, [facility] [address] [place],  [address] Seniorcenter_1 .Beverlee Nims [phone]  A.H.O.Y. Tai Chi Fee-based Donation based or free  [place] Class locations vary.  Call or email Angela Burke or view website for more information. Info_2 .com GainPain.com.cy.html [phone] Ongoing classes at local YMCAs and gyms Fee-based  Silver Sneakers A.C.T. By [employer]: [place] [facility] [place] [phone]  [phone] Classes  designed for older adults who want to improve their strength, flexibility, balance and endurance.  Silver sneakers is covered by some insurance plans and includes a fitness center membership at participating locations. Find out more by calling [phone] or visiting www.silversneakers.com Covered by some insurance plans  [facility] [place] [phone] A.H.O.Y., fitness room, personal training, fitness classes for injury prevention, strength, balance, flexibility, water fitness classes Ages 55+: $53 for 6 months; Ages 32-54: $55 for 6 months  Tai Chi for Everybody [facility] [address] [place], [place] [address] Taichiforeverybody_0 .Patsi Sears [phone] Tai Chi classes for beginners to advanced; geared for seniors Donation Based      [employer] (Helpling Others Participate in Exercise     Loyal Gambler. Rosana Hoes, PhD, [place] [email or web address] .edu [place]     [phone]     A comprehensive fitness program for adults.  The program paris senior-level undergraduates Kinesiology students with adults who desire to learn how to exercise safely.  Includes a structural exercise class focusing on functional fitnesss     $100/semester in fall and spring; $75 in summer (no trainers)    *Silver Sneakers is covered by some Personal assistant and includes a  Radio producer at participating locations.  Find out more by calling [phone] or visiting www.[email or web address]  For additional health and human services resources for senior adults, please contact SeniorLine at [phone] in [place] and [place] at [phone] in all other areas.

## 2014-03-26 NOTE — Therapy (Signed)
[facility] [address] [place], [place], [address] Phone: [phone]   Fax:  [phone]  Physical Therapy Treatment  Patient Details  Name: Alexander Bass MRN: [id number] Date of Birth: [birth year] Referring Provider:  Sharon Seller, NP  Encounter Date: 03/25/2014      PT End of Session - 03/26/14 1312    Visit Number 4   Number of Visits 9   Date for PT Re-Evaluation 05/11/14   Authorization Type Medicare-G-code every 10th visit   PT Start Time 1320   PT Stop Time 1400   PT Time Calculation (min) 40 min   Activity Tolerance Patient tolerated treatment well      No past medical history on file.  Past Surgical History  Procedure Laterality Date  . 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 Inguinal hernia repair Right 2008    There were no vitals taken for this visit.  Visit Diagnosis:  Muscle weakness of lower extremity  Postural instability      Subjective Assessment - 03/25/14 1322    Symptoms Had a bit of anxiety this morning, but feeling better now.   Currently in Pain? No/denies                    [facility] Adult PT Treatment/Exercise - 03/25/14 1339    Transfers   Transfers Sit to Stand;Stand to Sit   Sit to Stand 7: Independent;Without upper extremity assist;From bed;From chair/3-in-1  14 inch surface, 3 sets of 10 reps   Stand to Sit 7: Independent  Review of HEP-pt independent   High Level Balance   High Level Balance Activities Side stepping;Braiding;Tandem walking;Marching forwards;Marching backwards   High Level Balance Comments REview of HEP-pt demonstrates understanding   Exercises   Exercises Knee/Hip   Knee/Hip Exercises: Aerobic   Stationary Bike NuStep, Level 5, 4 extremities x 8 minutes for leg strengthening at steps/minute>70   Knee/Hip Exercises: Standing   Forward Step Up Right;Left;10 reps;Step Height: 6"  Side step taps 10 reps   Wall  Squat 10 reps;1 set    Added wall squats to HEP   Self Care:  Provided fitness information on [facility] exercise room and discussed potential transition to [facility] fitness room after next week's visit, due to pt's progress.           PT Education - 03/26/14 1312    Education provided Yes   Education Details HEP-wall squats, community fitness information   Person(s) Educated Patient   Methods Explanation;Demonstration;Handout   Comprehension Verbalized understanding;Returned demonstration             PT Long Term Goals - 03/14/14 0910    PT LONG TERM GOAL #1   Title Pt will be independent with HEP for improved balance, strength, mobility.   Time 4   Period Weeks   Status New   PT LONG TERM GOAL #2   Title Pt will improve Functional Gait Assessment to at least 22/30 for decreased fall risk.   Time 4   Period Weeks   Status New   PT LONG TERM GOAL #3   Title Pt will improve TUG cognitive score to less than or equal to 15 seconds for decreased fall risk.   Time 4   Period Weeks   Status New   PT LONG TERM GOAL #4   Title Pt will improve 5x sit<>stand to less than  or equal to 15 seconds for improved transfer efficiency and safety.   Time 4   Period Weeks   Status New   PT LONG TERM GOAL #5   Title Pt will verbalize plans for continued community fitness upon D/C from PT.   Time 4   Period Weeks   Status New               Plan - 03/26/14 1313    Clinical Impression Statement Pt appears to be performing exercises well at home; likes strengthening exercises   Pt will benefit from skilled therapeutic intervention in order to improve on the following deficits Abnormal gait;Decreased endurance;Decreased strength;Decreased mobility;Decreased balance   Rehab Potential Good   PT Frequency 2x / week   PT Duration 4 weeks  wk 2 of 4   PT Treatment/Interventions ADLs/Self Care Home Management;Therapeutic activities;Therapeutic exercise;Balance  training;Gait training;Neuromuscular re-education;Functional mobility training;Patient/family education   PT Next Visit Plan Review HEP; walking program, gait activities and strength; potential D/C next week for pt to progress to community fitness   Consulted and Agree with Plan of Care Patient        Problem List Patient Active Problem List   Diagnosis Date Noted  . Other and unspecified hyperlipidemia 09/17/2013  . BPH (benign prostatic hyperplasia) 09/17/2013  . Numbness in right leg 09/17/2013    MARRIOTT,AMY W. 03/26/2014, 1:16 PM   Lonia BloodAmy Marriott, PT 03/26/2014 1:17 PM Phone: [phone] Fax: [phone]  [facility] [address] [place], [place], [address] Phone: [phone]   Fax:  [phone]

## 2014-04-01 ENCOUNTER — Ambulatory Visit: Payer: Medicare Other | Admitting: Physical Therapy

## 2014-04-01 ENCOUNTER — Encounter: Payer: Self-pay | Admitting: Diagnostic Neuroimaging

## 2014-04-01 ENCOUNTER — Ambulatory Visit (INDEPENDENT_AMBULATORY_CARE_PROVIDER_SITE_OTHER): Payer: Medicare Other | Admitting: Diagnostic Neuroimaging

## 2014-04-01 VITALS — BP 120/58 | HR 60 | Ht 64.0 in | Wt 128.0 lb

## 2014-04-01 DIAGNOSIS — R269 Unspecified abnormalities of gait and mobility: Secondary | ICD-10-CM

## 2014-04-01 DIAGNOSIS — M6281 Muscle weakness (generalized): Secondary | ICD-10-CM

## 2014-04-01 DIAGNOSIS — G20 Parkinson's disease: Secondary | ICD-10-CM | POA: Diagnosis not present

## 2014-04-01 NOTE — Patient Instructions (Signed)
Monitor symptoms.  Continue physical therapy exercises at home.

## 2014-04-01 NOTE — Progress Notes (Signed)
[facility]  PATIENT: Alexander Bass DOB: 1945-05-13  REFERRING CLINICIAN: Janyth ContesEubanks HISTORY FROM: patient  REASON FOR VISIT: follow up   HISTORICAL  CHIEF COMPLAINT:  Chief Complaint  Patient presents with  . Follow-up    dizziness has improved, therapy helped    HISTORY OF PRESENT ILLNESS:   UPDATE 04/01/14: Since last visit, doing well. PT exercises helping. Left thumb tremor seems to be less than before. Also, he is planning to go back to [place] to celebrate his Ar[place]69th b-day with family.   PRIOR HPI (02/17/14): 69 year old right-handed male here for evaluation of balance/dizziness problem for past 1 month. Patient is noted intermittent episodes of staggering, slightly losing balance towards right side. This happens once per day. Symptoms are very brief lasting only a second or 2. No other associated symptoms. In 2010 patient was having some sinus/nose issues, seeing ENT specialist, who ordered imaging study. 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 Patient had resection, apparently through transsphenoidal approach, without any other issues. [place]. Patient has also noted some mild intermittent tremor in his left thumb. Patient feels he is slowing down generally with his movements as well as with his strength. Patient reports a sister with Parkinson's disease, diagnosed age 70-70 years old and deceased at age 70 years old. Patient's sister was 10 years older than patient.   REVIEW OF SYSTEMS: Full 14 system review of systems performed and notable only for hearing loss runny nose freq urination.    ALLERGIES: No Known Allergies  HOME MEDICATIONS: Outpatient Prescriptions Prior to Visit  Medication Sig Dispense Refill  . NON FORMULARY Vitamin Maka (Puru vitamin) 1 tablet daily  As needed.    . vitamin E 400 UNIT capsule Take 400 Units by mouth daily.    . tamsulosin (FLOMAX) 0.4 MG CAPS capsule Take 1  capsule (0.4 mg total) by mouth daily. 30 capsule 3   No facility-administered medications prior to visit.    PAST MEDICAL HISTORY: History reviewed. No pertinent past medical history.  PAST SURGICAL HISTORY: Past Surgical History  Procedure Laterality Date  . 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 Inguinal hernia repair Right 2008    FAMILY HISTORY: Family History  Problem Relation Age of Onset  . Cancer Mother   . Heart attack Father   . Heart disease Brother     SOCIAL HISTORY:  History   Social History  . Marital Status: Divorced    Spouse Name: N/A  . Number of Children: 3  . Years of Education: 12th   Occupational History  . retired     Social History Main Topics  . Smoking status: Former Smoker -- 0.25 packs/day for 20 years    Quit date: 01/24/1998  . Smokeless tobacco: Never Used  . Alcohol Use: No  . Drug Use: No  . Sexual Activity: Yes   Other Topics Concern  . Not on file   Social History Narrative   Exercise walks 5 days a week   Stopped smoking 2001   Stopped drinking alochol           PHYSICAL EXAM  Filed Vitals:   04/01/14 1338  BP: 120/58  Pulse: 60  Height: 5\' 4"  (1.626 m)  Weight: 128 lb (58.06 kg)    Body mass index is 21.96 kg/(m^2).  No exam data present  MMSE - Mini  Mental State Exam 06/20/2013  Orientation to time 5  Orientation to Place 5  Registration 3  Attention/ Calculation 5  Recall 3  Language- name 2 objects 2  Language- repeat 1  Language- follow 3 step command 3  Language- read & follow direction 1  Write a sentence 1  Copy design 1  Total score 30    GENERAL EXAM: Patient is in no distress; well developed, nourished and groomed; neck is supple; MASKED FACIES; POSITIVE PALMOMENTAL; NEG SNOUT AND MYERSONS.  CARDIOVASCULAR: Regular rate and rhythm, no murmurs, no carotid bruits  NEUROLOGIC: MENTAL STATUS: awake, alert, language fluent, comprehension intact, naming  intact, fund of knowledge appropriate CRANIAL NERVE: no papilledema on fundoscopic exam, pupils equal and reactive to light, visual fields full to confrontation, extraocular muscles intact, no nystagmus, facial sensation and strength symmetric, hearing intact, palate elevates symmetrically, uvula midline, shoulder shrug symmetric, tongue midline. MOTOR: normal bulk; MILD BRADYKINESIA IN BUE (LEFT SLOWER THAN RIGHT); full strength in the BUE, BLE SENSORY: normal and symmetric to light touch, temperature, vibration COORDINATION: finger-nose-finger, fine finger movements normal REFLEXES: deep tendon reflexes present and symmetric; ABSENT AT ANKLES GAIT/STATION: narrow based gait; SMOOTH GAIT; romberg is negative    DIAGNOSTIC DATA (LABS, IMAGING, TESTING) - I reviewed patient records, labs, notes, testing and imaging myself where available.  Lab Results  Component Value Date   WBC 4.9 05/16/2013   HGB 14.6 05/16/2013   HCT 42.6 05/16/2013   MCV 88 05/16/2013   PLT 311 05/16/2013      Component Value Date/Time   NA 143 05/16/2013 1053   K 4.4 05/16/2013 1053   CL 105 05/16/2013 1053   CO2 22 05/16/2013 1053   GLUCOSE 81 05/16/2013 1053   BUN 12 05/16/2013 1053   CREATININE 0.93 05/16/2013 1053   CALCIUM 9.0 05/16/2013 1053   PROT 7.2 05/16/2013 1053   AST 22 05/16/2013 1053   ALT 11 05/16/2013 1053   ALKPHOS 87 05/16/2013 1053   BILITOT 0.4 05/16/2013 1053   GFRNONAA 83 05/16/2013 1053   GFRAA 96 05/16/2013 1053   Lab Results  Component Value Date   CHOL 186 09/13/2013   HDL 75 09/13/2013   LDLCALC 98 09/13/2013   TRIG 64 09/13/2013   CHOLHDL 2.5 09/13/2013   Lab Results  Component Value Date   HGBA1C 5.7* 02/17/2014   Lab Results  Component Value Date   VITAMINB12 674 02/17/2014   Lab Results  Component Value Date   TSH 1.820 02/17/2014   I reviewed images myself and agree with interpretation. -VRP  03/04/14 MRI brain (with and without)  1. Minimal  periventricular and subcortical non-specific gliosis. No abnormal lesions are seen on post contrast views.  2. Post-surgical changes from prior sinus surgery and trans-sphenoidal cyst resection, and some irregularity within the clivus may also be post-surgical.     ASSESSMENT AND PLAN  69 y.o. year old male here with mild intermittent balance difficulties. On exam patient has some mild Parkinson's signs, as well as neuropathy versus lumbar radiculopathy signs.   Ddx: parkinsonism, neuropathy, normal aging  PLAN: - PT evaluation for balance/gait exercises and training - monitor for parkinson's sxs progression; no meds for now  Return in about 6 months (around 10/02/2014).    Suanne Marker, MD 04/01/2014, 2:06 PM Certified in Neurology, Neurophysiology and Neuroimaging  [facility] [address] [place], [place] [address] [phone]

## 2014-04-01 NOTE — Therapy (Signed)
[place] [address] [place], [place], [address] Phone: [phone]   Fax:  [phone]  Physical Therapy Treatment  Patient Details  Name: Alexander Bass MRN: [id number] Date of Birth: [birth year] Referring Provider:  Lauree Chandler, NP  Encounter Date: 04/01/2014      PT End of Session - 04/01/14 1144    Visit Number 5   Date for PT Re-Evaluation 05/11/14   Authorization Type Medicare-G-code every 10th visit   PT Start Time 1105   PT Stop Time 1146   PT Time Calculation (min) 41 min   Activity Tolerance Patient tolerated treatment well   Behavior During Therapy [facility] for tasks assessed/performed      No past medical history on file.  Past Surgical History  Procedure Laterality Date  . Brain surgery  2010    Dr Deatra Ina- in [place] due to tumor  . Inguinal hernia repair Right 2008    There were no vitals taken for this visit.  Visit Diagnosis:  Muscle weakness of lower extremity  Abnormality of gait      Subjective Assessment - 04/01/14 1109    Symptoms Went by [facility]; I think I'll like it, but I won't start until I get back from my trip to [place] (leave at the end of this month)   Currently in Pain? No/denies          [facility] PT Assessment - 04/01/14 1113    Observation/Other Assessments   Focus on Therapeutic Outcomes (FOTO)  Functional Score at discharge-unable to find-FOTO was completed, as therapist had to help pt answer questions, but did not print or show on FOTO website   Timed Up and Go Test   Normal TUG (seconds) 9.38   Cognitive TUG (seconds) 9.18   Functional Gait  Assessment   Gait assessed  Yes   Gait Level Surface Walks 20 ft in less than 7 sec but greater than 5.5 sec, uses assistive device, slower speed, mild gait deviations, or deviates 6-10 in outside of the 12 in walkway width.   Change in Gait Speed Able to smoothly change walking speed without loss of balance or gait  deviation. Deviate no more than 6 in outside of the 12 in walkway width.   Gait with Horizontal Head Turns Performs head turns smoothly with no change in gait. Deviates no more than 6 in outside 12 in walkway width   Gait with Vertical Head Turns Performs head turns with no change in gait. Deviates no more than 6 in outside 12 in walkway width.   Gait and Pivot Turn Pivot turns safely within 3 sec and stops quickly with no loss of balance.   Step Over Obstacle Is able to step over 2 stacked shoe boxes taped together (9 in total height) without changing gait speed. No evidence of imbalance.   Gait with Narrow Base of Support Is able to ambulate for 10 steps heel to toe with no staggering.   Gait with Eyes Closed Cannot walk 20 ft without assistance, severe gait deviations or imbalance, deviates greater than 15 in outside 12 in walkway width or will not attempt task.   Ambulating Backwards Walks 20 ft, no assistive devices, good speed, no evidence for imbalance, normal gait   Steps Alternating feet, no rail.   Total Score 26                  [facility] Adult PT Treatment/Exercise - 04/01/14 1113  Transfers   Transfers Sit to Stand;Stand to Sit   Sit to Stand 7: Independent;Without upper extremity assist;From chair/3-in-1;Five times sit to stand  5x sit<>stand 10.98 sec   Stand to Sit 7: Independent   Ambulation/Gait   Ambulation/Gait Yes   Ambulation/Gait Assistance 7: Independent   Ambulation Distance (Feet) 1000 Feet   Assistive device None   Gait Pattern Within Functional Limits   Ambulation Surface Level;Unlevel;Indoor;Outdoor;Paved   Stairs Yes   Stairs Assistance 7: Independent   Stair Management Technique No rails;Alternating pattern   Number of Stairs 4   Height of Stairs 6   Knee/Hip Exercises: Aerobic   Stationary Bike NuStep, Level 6, 4 extremities x 8 minutes for leg strengthening at steps/minute>70  >90 steps/minute   Knee/Hip Exercises: Standing   Forward Step Up  Right;Left;10 reps;Step Height: 6"  Review of HEP-pt independent   Wall Squat 10 reps;1 set  Review of HEP-pt independent                     PT Long Term Goals - 04-20-14 1145    PT LONG TERM GOAL #1   Title Pt will be independent with HEP for improved balance, strength, mobility.   Status Achieved   PT LONG TERM GOAL #2   Title Pt will improve Functional Gait Assessment to at least 22/30 for decreased fall risk.   Status Achieved  Functional Gait Assessment 26/30, improved from 20/30   PT LONG TERM GOAL #3   Title Pt will improve TUG cognitive score to less than or equal to 15 seconds for decreased fall risk.   Status Achieved  TUG cognitive score 9.18 sec   PT LONG TERM GOAL #4   Title Pt will improve 5x sit<>stand to less than or equal to 15 seconds for improved transfer efficiency and safety.   Status Achieved  10.98 sec   PT LONG TERM GOAL #5   Title Pt will verbalize plans for continued community fitness upon D/C from PT.   Status Achieved               Plan - 04/20/14 1145    Clinical Impression Statement Pt has met all LTGs and pt is planning to [place] as community exercise option.  Pt is appropriate for D/C at this time.   PT Next Visit Plan D/C PT at this time.          G-Codes - 04-20-2014 1206/04/07    Functional Assessment Tool Used Functional Gait Assessment 26/30; TUG cognitive 9.18 sec, 5x sit<>stand 10.98 sec   Functional Limitation Mobility: Walking and moving around   Mobility: Walking and Moving Around Goal Status [phone]) At least 1 percent but less than 20 percent impaired, limited or restricted   Mobility: Walking and Moving Around Discharge Status [phone]) 0 percent impaired, limited or restricted      Problem List Patient Active Problem List   Diagnosis Date Noted  . Other and unspecified hyperlipidemia 09/17/2013  . BPH (benign prostatic hyperplasia) 09/17/2013  . Numbness in right leg 09/17/2013  PHYSICAL THERAPY  DISCHARGE SUMMARY  Visits from Start of Care: 5   Current functional level related to goals / functional outcomes:     PT Long Term Goals - Apr 20, 2014 1145    PT LONG TERM GOAL #1   Title Pt will be independent with HEP for improved balance, strength, mobility.   Status Achieved   PT LONG TERM GOAL #2   Title Pt will  improve Functional Gait Assessment to at least 22/30 for decreased fall risk.   Status Achieved  Functional Gait Assessment 26/30, improved from 20/30   PT LONG TERM GOAL #3   Title Pt will improve TUG cognitive score to less than or equal to 15 seconds for decreased fall risk.   Status Achieved  TUG cognitive score 9.18 sec   PT LONG TERM GOAL #4   Title Pt will improve 5x sit<>stand to less than or equal to 15 seconds for improved transfer efficiency and safety.   Status Achieved  10.98 sec   PT LONG TERM GOAL #5   Title Pt will verbalize plans for continued community fitness upon D/C from PT.   Status Achieved       Remaining deficits: NA-pt has improved strength, endurance and balance   Education / Equipment: HEP, community fitness, with pt verbalizing understanding  Plan: Patient agrees to discharge.  Patient goals were partially met. Patient is being discharged due to meeting the stated rehab goals.  ?????      Liza Czerwinski W. 04/01/2014, 12:14 PM  Mady Haagensen, PT 04/01/2014 12:15 PM Phone: [phone] Fax: [facility] [address] [place], [place], [address] Phone: [phone]   Fax:  [phone]

## 2014-04-03 ENCOUNTER — Ambulatory Visit: Payer: Medicare Other | Admitting: Physical Therapy

## 2014-04-07 ENCOUNTER — Ambulatory Visit: Payer: Self-pay | Admitting: Physical Therapy

## 2014-04-09 ENCOUNTER — Ambulatory Visit: Payer: Self-pay | Admitting: Physical Therapy

## 2014-06-05 ENCOUNTER — Ambulatory Visit: Payer: Medicare Other | Admitting: Nurse Practitioner

## 2014-07-10 ENCOUNTER — Encounter: Payer: Self-pay | Admitting: Nurse Practitioner

## 2014-07-10 ENCOUNTER — Ambulatory Visit (INDEPENDENT_AMBULATORY_CARE_PROVIDER_SITE_OTHER): Payer: Medicare Other | Admitting: Nurse Practitioner

## 2014-07-10 VITALS — BP 120/76 | HR 68 | Temp 98.2°F | Resp 18 | Ht 64.0 in | Wt 130.0 lb

## 2014-07-10 DIAGNOSIS — N400 Enlarged prostate without lower urinary tract symptoms: Secondary | ICD-10-CM | POA: Diagnosis not present

## 2014-07-10 DIAGNOSIS — R42 Dizziness and giddiness: Secondary | ICD-10-CM | POA: Diagnosis not present

## 2014-07-10 DIAGNOSIS — E785 Hyperlipidemia, unspecified: Secondary | ICD-10-CM

## 2014-07-10 NOTE — Patient Instructions (Signed)
Follow up in 6 months for physical    Cardiac Diet This diet can help prevent heart disease and stroke. Many factors influence your heart health, including eating and exercise habits. Coronary risk rises a lot with abnormal blood fat (lipid) levels. Cardiac meal planning includes limiting unhealthy fats, increasing healthy fats, and making other small dietary changes. General guidelines are as follows:  Adjust calorie intake to reach and maintain desirable body weight.  Limit total fat intake to less than 30% of total calories. Saturated fat should be less than 7% of calories.  Saturated fats are found in animal products and in some vegetable products. Saturated vegetable fats are found in coconut oil, cocoa butter, palm oil, and palm kernel oil. Read labels carefully to avoid these products as much as possible. Use butter in moderation. Choose tub margarines and oils that have 2 grams of fat or less. Good cooking oils are canola and olive oils.  Practice low-fat cooking techniques. Do not fry food. Instead, broil, bake, boil, steam, grill, roast on a rack, stir-fry, or microwave it. Other fat reducing suggestions include:  Remove the skin from poultry.  Remove all visible fat from meats.  Skim the fat off stews, soups, and gravies before serving them.  Steam vegetables in water or broth instead of sauting them in fat.  Avoid foods with trans fat (or hydrogenated oils), such as commercially fried foods and commercially baked goods. Commercial shortening and deep-frying fats will contain trans fat.  Increase intake of fruits, vegetables, whole grains, and legumes to replace foods high in fat.  Increase consumption of nuts, legumes, and seeds to at least 4 servings weekly. One serving of a legume equals  cup, and 1 serving of nuts or seeds equals  cup.  Choose whole grains more often. Have 3 servings per day (a serving is 1 ounce [oz]).  Eat 4 to 5 servings of vegetables per day. A  serving of vegetables is 1 cup of raw leafy vegetables;  cup of raw or cooked cut-up vegetables;  cup of vegetable juice.  Eat 4 to 5 servings of fruit per day. A serving of fruit is 1 medium whole fruit;  cup of dried fruit;  cup of fresh, frozen, or canned fruit;  cup of 100% fruit juice.  Increase your intake of dietary fiber to 20 to 30 grams per day. Insoluble fiber may help lower your risk of heart disease and may help curb your appetite.  Soluble fiber binds cholesterol to be removed from the blood. Foods high in soluble fiber are dried beans, citrus fruits, oats, apples, bananas, broccoli, Brussels sprouts, and eggplant.  Try to include foods fortified with plant sterols or stanols, such as yogurt, breads, juices, or margarines. Choose several fortified foods to achieve a daily intake of 2 to 3 grams of plant sterols or stanols.  Foods with omega-3 fats can help reduce your risk of heart disease. Aim to have a 3.5 oz portion of fatty fish twice per week, such as salmon, mackerel, albacore tuna, sardines, lake trout, or herring. If you wish to take a fish oil supplement, choose one that contains 1 gram of both DHA and EPA.  Limit processed meats to 2 servings (3 oz portion) weekly.  Limit the sodium in your diet to 1500 milligrams (mg) per day. If you have high blood pressure, talk to a registered dietitian about a DASH (Dietary Approaches to Stop Hypertension) eating plan.  Limit sweets and beverages with added sugar, such as  soda, to no more than 5 servings per week. One serving is:   1 tablespoon sugar.  1 tablespoon jelly or jam.   cup sorbet.  1 cup lemonade.   cup regular soda. CHOOSING FOODS Starches  Allowed: Breads: All kinds (wheat, rye, raisin, white, oatmeal, [place], [place], and English muffin bread). Low-fat rolls: English muffins, frankfurter and hamburger buns, bagels, pita bread, tortillas (not fried). Pancakes, waffles, biscuits, and muffins made with  recommended oil.  Avoid: Products made with saturated or trans fats, oils, or whole milk products. Butter rolls, cheese breads, croissants. Commercial doughnuts, muffins, sweet rolls, biscuits, waffles, pancakes, store-bought mixes. Crackers  Allowed: Low-fat crackers and snacks: Animal, graham, rye, saltine (with recommended oil, no lard), oyster, and matzo crackers. Bread sticks, melba toast, rusks, flatbread, pretzels, and light popcorn.  Avoid: High-fat crackers: cheese crackers, butter crackers, and those made with coconut, palm oil, or trans fat (hydrogenated oils). Buttered popcorn. Cereals  Allowed: Hot or cold whole-grain cereals.  Avoid: Cereals containing coconut, hydrogenated vegetable fat, or animal fat. Potatoes / Pasta / Rice  Allowed: All kinds of potatoes, rice, and pasta (such as macaroni, spaghetti, and noodles).  Avoid: Pasta or rice prepared with cream sauce or high-fat cheese. Chow mein noodles, [place] fries. Vegetables  Allowed: All vegetables and vegetable juices.  Avoid: Fried vegetables. Vegetables in cream, butter, or high-fat cheese sauces. Limit coconut. Fruit in cream or custard. Protein  Allowed: Limit your intake of meat, seafood, and poultry to no more than 6 oz (cooked weight) per day. All lean, well-trimmed beef, veal, pork, and lamb. All chicken and [place] without skin. All fish and shellfish. Wild game: wild duck, rabbit, pheasant, and venison. Egg whites or low-cholesterol egg substitutes may be used as desired. Meatless dishes: recipes with dried beans, peas, lentils, and tofu (soybean curd). Seeds and nuts: all seeds and most nuts.  Avoid: Prime grade and other heavily marbled and fatty meats, such as short ribs, spare ribs, rib eye roast or steak, frankfurters, sausage, bacon, and high-fat luncheon meats, mutton. Caviar. Commercially fried fish. Domestic duck, goose, venison sausage. Organ meats: liver, gizzard, heart, chitterlings, brains, kidney,  sweetbreads. Dairy  Allowed: Low-fat cheeses: nonfat or low-fat cottage cheese (1% or 2% fat), cheeses made with part skim milk, such as mozzarella, farmers, string, or ricotta. (Cheeses should be labeled no more than 2 to 6 grams fat per oz.). Skim (or 1%) milk: liquid, powdered, or evaporated. Buttermilk made with low-fat milk. Drinks made with skim or low-fat milk or cocoa. Chocolate milk or cocoa made with skim or low-fat (1%) milk. Nonfat or low-fat yogurt.  Avoid: Whole milk cheeses, including colby, cheddar, [place], [address], [place], [place], [place], [address], Swiss, and blue. Creamed cottage cheese, cream cheese. Whole milk and whole milk products, including buttermilk or yogurt made from whole milk, drinks made from whole milk. Condensed milk, evaporated whole milk, and 2% milk. Soups and Combination Foods  Allowed: Low-fat low-sodium soups: broth, dehydrated soups, homemade broth, soups with the fat removed, homemade cream soups made with skim or low-fat milk. Low-fat spaghetti, lasagna, chili, and Spanish rice if low-fat ingredients and low-fat cooking techniques are used.  Avoid: Cream soups made with whole milk, cream, or high-fat cheese. All other soups. Desserts and Sweets  Allowed: Sherbet, fruit ices, gelatins, meringues, and angel food cake. Homemade desserts with recommended fats, oils, and milk products. Jam, jelly, honey, marmalade, sugars, and syrups. Pure sugar candy, such as gum drops, hard candy, jelly beans, marshmallows, mints,  and small amounts of dark chocolate.  Avoid: Commercially prepared cakes, pies, cookies, frosting, pudding, or mixes for these products. Desserts containing whole milk products, chocolate, coconut, lard, palm oil, or palm kernel oil. Ice cream or ice cream drinks. Candy that contains chocolate, coconut, butter, hydrogenated fat, or unknown ingredients. Buttered syrups. Fats and Oils  Allowed: Vegetable oils: safflower, sunflower, corn,  soybean, cottonseed, sesame, canola, olive, or peanut. Non-hydrogenated margarines. Salad dressing or mayonnaise: homemade or commercial, made with a recommended oil. Low or nonfat salad dressing or mayonnaise.  Limit added fats and oils to 6 to 8 tsp per day (includes fats used in cooking, baking, salads, and spreads on bread). Remember to count the "hidden fats" in foods.  Avoid: Solid fats and shortenings: butter, lard, salt pork, bacon drippings. Gravy containing meat fat, shortening, or suet. Cocoa butter, coconut. Coconut oil, palm oil, palm kernel oil, or hydrogenated oils: these ingredients are often used in bakery products, nondairy creamers, whipped toppings, candy, and commercially fried foods. Read labels carefully. Salad dressings made of unknown oils, sour cream, or cheese, such as blue cheese and Roquefort. Cream, all kinds: half-and-half, light, heavy, or whipping. Sour cream or cream cheese (even if "light" or low-fat). Nondairy cream substitutes: coffee creamers and sour cream substitutes made with palm, palm kernel, hydrogenated oils, or coconut oil. Beverages  Allowed: Coffee (regular or decaffeinated), tea. Diet carbonated beverages, mineral water. Alcohol: Check with your caregiver. Moderation is recommended.  Avoid: Whole milk, regular sodas, and juice drinks with added sugar. Condiments  Allowed: All seasonings and condiments. Cocoa powder. "Cream" sauces made with recommended ingredients.  Avoid: Carob powder made with hydrogenated fats. SAMPLE MENU Breakfast   cup orange juice   cup oatmeal  1 slice toast  1 tsp margarine  1 cup skim milk Lunch  [place] sandwich with 2 oz [place], 2 slices bread  Lettuce and tomato slices  Fresh fruit  Carrot sticks  Coffee or tea Snack  Fresh fruit or low-fat crackers Dinner  3 oz lean ground beef  1 baked potato  1 tsp margarine   cup asparagus  Lettuce salad  1 tbs non-creamy dressing   cup peach  slices  1 cup skim milk Document Released: 10/20/2007 Document Revised: 07/12/2011 Document Reviewed: 03/12/2013 Exit[employer] Patient Information 2015 [place], [place]. This information is not intended to replace advice given to you by your health care provider. Make sure you discuss any questions you have with your health care provider.

## 2014-07-10 NOTE — Progress Notes (Signed)
Patient ID: Alexander Bass, male   DOB: [birth year], 69 y.o.   MRN: [id number]    PCP: Sharon Seller, NP  No Known Allergies  Chief Complaint  Patient presents with  . Medical Management of Chronic Issues     HPI: Patient is a 69 y.o. male seen in the office today for 4 month follow up on chronic conditions. Just got back from [place], visiting family for his birthday.   Since last visit pt has followed up with neurology who diagnosed him with parkinson but did not start medication. Completed therapy, balance issues "ok" per pt. No falls noted. No dizziness.  No urinary complaints, started on flomax but has not continued, not having issues Only taking vit E Reports poor diet, eating a lot of sugar, foods high in fat because he like these things  Advanced Directive information Does patient have an advance directive?: No, Would patient like information on creating an advanced directive?: Yes - Educational materials given Review of Systems:  Review of Systems  Constitutional: Negative for activity change, appetite change, fatigue and unexpected weight change.  HENT: Negative for congestion, hearing loss, rhinorrhea and sinus pressure.   Eyes: Negative.   Respiratory: Negative for cough and shortness of breath.   Cardiovascular: Negative for chest pain, palpitations and leg swelling.  Gastrointestinal: Negative for abdominal pain, diarrhea and constipation.  Genitourinary: Negative for dysuria and difficulty urinating.  Musculoskeletal: Negative for myalgias and arthralgias.  Skin: Negative for color change and wound.  Neurological: Negative for dizziness, tremors, weakness and headaches.  Psychiatric/Behavioral: Negative for behavioral problems, confusion and agitation.    No past medical history on file. Past Surgical History  Procedure Laterality Date  . 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 Inguinal hernia repair Right 2008   Social History:  reports that he quit smoking about 16 years ago. He has never used smokeless tobacco. He reports that he does not drink alcohol or use illicit drugs.  Family History  Problem Relation Age of Onset  . Cancer Mother   . Heart attack Father   . Heart disease Brother     Medications: Patient's Medications  New Prescriptions   No medications on file  Previous Medications   NON FORMULARY    Vitamin Maka (Puru vitamin) 1 tablet daily  As needed.   VITAMIN E 400 UNIT CAPSULE    Take 400 Units by mouth daily.  Modified Medications   No medications on file  Discontinued Medications   No medications on file     Physical Exam:  Filed Vitals:   07/10/14 0937  BP: 120/76  Pulse: 68  Temp: 98.2 F (36.8 C)  TempSrc: Oral  Resp: 18  Height: 5\' 4"  (1.626 m)  Weight: 130 lb (58.968 kg)  SpO2: 97%    Physical Exam  Constitutional: He is oriented to person, place, and time. He appears well-developed and well-nourished. No distress.  HENT:  Head: Normocephalic and atraumatic.  Mouth/Throat: Oropharynx is clear and moist. No oropharyngeal exudate.  Eyes: Conjunctivae and EOM are normal. Pupils are equal, round, and reactive to light.  Neck: Normal range of motion. Neck supple.  Cardiovascular: Normal rate, regular rhythm and normal heart sounds.   Pulmonary/Chest: Effort normal and breath sounds normal.  Abdominal: Soft. Bowel sounds are normal.  Musculoskeletal: He exhibits no edema or tenderness.  Neurological: He is alert and oriented to person, place, and time. He displays normal reflexes.  No cranial nerve deficit. Coordination normal.  Skin: Skin is warm and dry. He is not diaphoretic.  Psychiatric: He has a normal mood and affect.    Labs reviewed: Basic Metabolic Panel:  Recent Labs  16/10/96 1119  TSH 1.820   Liver Function Tests: No results for input(s): AST, ALT, ALKPHOS, BILITOT, PROT, ALBUMIN in the last 8760 hours. No results for input(s): LIPASE, AMYLASE in  the last 8760 hours. No results for input(s): AMMONIA in the last 8760 hours. CBC: No results for input(s): WBC, NEUTROABS, HGB, HCT, MCV, PLT in the last 8760 hours. Lipid Panel:  Recent Labs  09/13/13 0913  CHOL 186  HDL 75  LDLCALC 98  TRIG 64  CHOLHDL 2.5   TSH:  Recent Labs  02/17/14 1119  TSH 1.820   A1C: Lab Results  Component Value Date   HGBA1C 5.7* 02/17/2014     Assessment/Plan  1. BPH (benign prostatic hyperplasia) -improved symptoms, does not take medication, no complaints  2. Hyperlipidemia -reports bad diet, discussed heart healthy diet and increase exercise. Will follow up labs - Comprehensive metabolic panel - Lipid panel - CBC with Differential  3. Dizziness -improved, no current complaints -following with neurology   Follow up in 6 months for EV with MMSE

## 2014-07-11 LAB — COMPREHENSIVE METABOLIC PANEL
ALBUMIN: 4.1 g/dL (ref 3.5–4.8)
ALT: 16 IU/L (ref 0–44)
AST: 22 IU/L (ref 0–40)
Albumin/Globulin Ratio: 1.3 (ref 1.1–2.5)
Alkaline Phosphatase: 93 IU/L (ref 39–117)
BILIRUBIN TOTAL: 0.6 mg/dL (ref 0.0–1.2)
BUN / CREAT RATIO: 16 (ref 10–22)
BUN: 16 mg/dL (ref 8–27)
CALCIUM: 9.3 mg/dL (ref 8.6–10.2)
CO2: 22 mmol/L (ref 18–29)
CREATININE: 0.97 mg/dL (ref 0.76–1.27)
Chloride: 103 mmol/L (ref 97–108)
GFR, EST AFRICAN AMERICAN: 91 mL/min/{1.73_m2} (ref >59)
GFR, EST NON AFRICAN AMERICAN: 79 mL/min/{1.73_m2} (ref >59)
Globulin, Total: 3.2 g/dL (ref 1.5–4.5)
Glucose: 79 mg/dL (ref 65–99)
Potassium: 4 mmol/L (ref 3.5–5.2)
Sodium: 142 mmol/L (ref 134–144)
TOTAL PROTEIN: 7.3 g/dL (ref 6.0–8.5)

## 2014-07-11 LAB — CBC WITH DIFFERENTIAL/PLATELET
Basophils Absolute: 0 10*3/uL (ref 0.0–0.2)
Basos: 0 %
EOS (ABSOLUTE): 0.1 10*3/uL (ref 0.0–0.4)
Eos: 1 %
Hematocrit: 42.9 % (ref 37.5–51.0)
Hemoglobin: 14.2 g/dL (ref 12.6–17.7)
IMMATURE GRANULOCYTES: 0 %
Immature Grans (Abs): 0 10*3/uL (ref 0.0–0.1)
Lymphocytes Absolute: 1.8 10*3/uL (ref 0.7–3.1)
Lymphs: 28 %
MCH: 30.1 pg (ref 26.6–33.0)
MCHC: 33.1 g/dL (ref 31.5–35.7)
MCV: 91 fL (ref 79–97)
MONOS ABS: 0.4 10*3/uL (ref 0.1–0.9)
Monocytes: 7 %
Neutrophils Absolute: 4 10*3/uL (ref 1.4–7.0)
Neutrophils: 64 %
PLATELETS: 280 10*3/uL (ref 150–379)
RBC: 4.71 x10E6/uL (ref 4.14–5.80)
RDW: 13.9 % (ref 12.3–15.4)
WBC: 6.3 10*3/uL (ref 3.4–10.8)

## 2014-07-11 LAB — LIPID PANEL
CHOL/HDL RATIO: 2.6 ratio (ref 0.0–5.0)
Cholesterol, Total: 183 mg/dL (ref 100–199)
HDL: 70 mg/dL (ref >39)
LDL Calculated: 103 mg/dL — ABNORMAL HIGH (ref 0–99)
TRIGLYCERIDES: 50 mg/dL (ref 0–149)
VLDL CHOLESTEROL CAL: 10 mg/dL (ref 5–40)

## 2014-10-01 ENCOUNTER — Ambulatory Visit: Payer: Medicare Other | Admitting: Diagnostic Neuroimaging

## 2015-01-08 ENCOUNTER — Encounter: Payer: Medicare Other | Admitting: Nurse Practitioner

## 2015-01-15 ENCOUNTER — Ambulatory Visit (INDEPENDENT_AMBULATORY_CARE_PROVIDER_SITE_OTHER): Payer: Medicare Other | Admitting: Nurse Practitioner

## 2015-01-15 ENCOUNTER — Encounter: Payer: Self-pay | Admitting: Nurse Practitioner

## 2015-01-15 VITALS — BP 116/72 | HR 66 | Temp 97.8°F | Resp 18 | Ht 64.0 in | Wt 138.6 lb

## 2015-01-15 DIAGNOSIS — N400 Enlarged prostate without lower urinary tract symptoms: Secondary | ICD-10-CM

## 2015-01-15 DIAGNOSIS — Z1211 Encounter for screening for malignant neoplasm of colon: Secondary | ICD-10-CM | POA: Diagnosis not present

## 2015-01-15 DIAGNOSIS — E785 Hyperlipidemia, unspecified: Secondary | ICD-10-CM | POA: Diagnosis not present

## 2015-01-15 DIAGNOSIS — Z23 Encounter for immunization: Secondary | ICD-10-CM | POA: Diagnosis not present

## 2015-01-15 DIAGNOSIS — Z0000 Encounter for general adult medical examination without abnormal findings: Secondary | ICD-10-CM | POA: Diagnosis not present

## 2015-01-15 NOTE — Progress Notes (Signed)
Patient ID: Alexander Bass, male   DOB: 21-Aug-1945, 69 y.o.   MRN: [id number]    PCP: Sharon SellerEUBANKS, Keyonta Barradas K, NP  Advanced Directive information Does patient have an advance directive?: No, Would patient like information on creating an advanced directive?: Yes - Educational materials given  No Known Allergies  Chief Complaint  Patient presents with  . Annual Exam    Annual Exam  . Immunizations    Refused flu shot     HPI: Patient is a 69 y.o. male seen in the office today annual exam.  Screenings: Colon Cancer- last in 2004  Prostate Cancer-09/13/13 PSA 3.0, no changes to urination, no increase in frequency  Depression screening [facility] 01/15/2015 07/10/2014 02/06/2014 06/20/2013  Decreased Interest 0 0 0 0  Down, Depressed, Hopeless 0 0 0 0  PHQ - 2 Score 0 0 0 0   Falls Fall Risk  01/15/2015 07/10/2014 02/06/2014 09/17/2013 06/20/2013  Falls in the past year? No No No No No   MMSE MMSE - Mini Mental State Exam 01/15/2015 06/20/2013  Not completed: (No Data) -  Orientation to time 5 5  Orientation to Place 5 5  Registration 3 3  Attention/ Calculation 4 5  Recall 2 3  Language- name 2 objects 2 2  Language- repeat 1 1  Language- follow 3 step command 3 3  Language- read & follow direction 1 1  Write a sentence 1 1  Copy design 1 1  Total score 28 30   Vaccines Immunization History  Administered Date(s) Administered  . Pneumococcal Conjugate-13 06/20/2013    Smoking status: former smoker, light hx Alcohol use: occasionally will socially will drink  Dentist: not routinely Ophthalmologist: does not routinely go  Exercise regimen: 5 days a week 20- 30 mins of watlking Diet: none  Functional Status of ADLs: independent of all ADLs    Review of Systems:  Review of Systems  Constitutional: Negative for activity change, appetite change, fatigue and unexpected weight change.  HENT: Negative for congestion, hearing loss, rhinorrhea and sinus pressure.    Eyes: Negative.   Respiratory: Negative for cough and shortness of breath.   Cardiovascular: Negative for chest pain, palpitations and leg swelling.  Gastrointestinal: Negative for abdominal pain, diarrhea and constipation.  Genitourinary: Negative for dysuria and difficulty urinating.  Musculoskeletal: Negative for myalgias and arthralgias.  Skin: Negative for color change and wound.  Neurological: Negative for dizziness, tremors, weakness and headaches.  Psychiatric/Behavioral: Negative for behavioral problems, confusion and agitation.    History reviewed. No pertinent past medical history. Past Surgical History  Procedure Laterality Date  . 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 Inguinal hernia repair Right 2008   Social History:   reports that he quit smoking about 16 years ago. He has never used smokeless tobacco. He reports that he does not drink alcohol or use illicit drugs.  Family History  Problem Relation Age of Onset  . Cancer Mother   . Heart attack Father   . Heart disease Brother     Medications: Patient's Medications  New Prescriptions   No medications on file  Previous Medications   NON FORMULARY    Vitamin Maka (Puru vitamin) 1 tablet daily  As needed.   VITAMIN E 400 UNIT CAPSULE    Take 400 Units by mouth daily.  Modified Medications   No medications on file  Discontinued Medications   No medications on file  Physical Exam:  Filed Vitals:   01/15/15 1320  BP: 116/72  Pulse: 66  Temp: 97.8 F (36.6 C)  TempSrc: Oral  Resp: 18  Height:  (1.626 m)  Weight: 138 lb 9.6 oz (62.869 kg)  SpO2: 99%   Body mass index is 23.78 kg/(m^2).  Physical Exam  Constitutional: He is oriented to person, place, and time. He appears well-developed and well-nourished. No distress.  HENT:  Head: Normocephalic and atraumatic.  Right Ear: External ear normal.  Left Ear: External ear normal.  Nose: Nose normal.  Mouth/Throat:  Oropharynx is clear and moist. No oropharyngeal exudate.  Eyes: Conjunctivae and EOM are normal. Pupils are equal, round, and reactive to light.  Neck: Normal range of motion. Neck supple.  Cardiovascular: Normal rate, regular rhythm, normal heart sounds and intact distal pulses.   Pulmonary/Chest: Effort normal and breath sounds normal.  Abdominal: Soft. Bowel sounds are normal.  Musculoskeletal: Normal range of motion. He exhibits no edema or tenderness.  Neurological: He is alert and oriented to person, place, and time. He displays normal reflexes. No cranial nerve deficit. Coordination normal.  Skin: Skin is warm and dry. He is not diaphoretic.  Psychiatric: He has a normal mood and affect.    Labs reviewed: Basic Metabolic Panel:  Recent Labs  16/10/96 1119 07/10/14 1021  NA  --  142  K  --  4.0  CL  --  103  CO2  --  22  GLUCOSE  --  79  BUN  --  16  CREATININE  --  0.97  CALCIUM  --  9.3  TSH 1.820  --    Liver Function Tests:  Recent Labs  07/10/14 1021  AST 22  ALT 16  ALKPHOS 93  BILITOT 0.6  PROT 7.3  ALBUMIN 4.1   No results for input(s): LIPASE, AMYLASE in the last 8760 hours. No results for input(s): AMMONIA in the last 8760 hours. CBC:  Recent Labs  07/10/14 1021  WBC 6.3  NEUTROABS 4.0  HCT 42.9   Lipid Panel:  Recent Labs  07/10/14 1021  CHOL 183  HDL 70  LDLCALC 103*  TRIG 50  CHOLHDL 2.6   TSH:  Recent Labs  02/17/14 1119  TSH 1.820   A1C: Lab Results  Component Value Date   HGBA1C 5.7* 02/17/2014     Assessment/Plan 1. Hyperlipidemia -not fasting today, diet modifications reinforced.  - Comprehensive metabolic panel - Lipid panel - CBC with Differential -EKG done and WNL  2. BPH (benign prostatic hyperplasia) -stable no changes to stream or frequency - will follow PSA  3. Medicare annual wellness visit, subsequent The patient is doing well and no distinct problems were identified on exam. The patient was  counseled regarding the appropriate use of alcohol, revention of dental and periodontal disease, diet, regular sustained exercise for at least 30 minutes 5 times per week, testicular self-examination on a monthly basis,smoking cessation, tobacco use,  and recommended schedule for GI hemoccult testing, colonoscopy, cholesterol, thyroid and diabetes screening. -GI referral placed, recommended to follow up with dentist and ophthalmology -exercise and diet reinforced.  -MMSE stable  4. Special screening for malignant neoplasms, colon - Ambulatory referral to Gastroenterology  To follow up in 6 months, sooner if needed Carlin Mamone K. Biagio Borg  [facility] [phone] 8 am - 5 pm) [phone] (after hours)

## 2015-01-15 NOTE — Patient Instructions (Signed)
Recommend making dental appt for routine follow up Needs ophthalmology exam.  Exercise 30 mins 5 days a week Heart healthy diet Review advance directive and bring to office to add to your chart.    Will get lab work today  Follow up in 6 months

## 2015-01-16 ENCOUNTER — Encounter: Payer: Self-pay | Admitting: Gastroenterology

## 2015-01-16 LAB — CBC WITH DIFFERENTIAL/PLATELET
BASOS ABS: 0 10*3/uL (ref 0.0–0.2)
Basos: 0 %
EOS (ABSOLUTE): 0.1 10*3/uL (ref 0.0–0.4)
Eos: 1 %
HEMATOCRIT: 39.6 % (ref 37.5–51.0)
Hemoglobin: 13.6 g/dL (ref 12.6–17.7)
IMMATURE GRANS (ABS): 0 10*3/uL (ref 0.0–0.1)
Immature Granulocytes: 0 %
LYMPHS ABS: 1.8 10*3/uL (ref 0.7–3.1)
Lymphs: 34 %
MCH: 30.5 pg (ref 26.6–33.0)
MCHC: 34.3 g/dL (ref 31.5–35.7)
MCV: 89 fL (ref 79–97)
Monocytes Absolute: 0.4 10*3/uL (ref 0.1–0.9)
Monocytes: 8 %
Neutrophils Absolute: 2.9 10*3/uL (ref 1.4–7.0)
Neutrophils: 57 %
Platelets: 286 10*3/uL (ref 150–379)
RBC: 4.46 x10E6/uL (ref 4.14–5.80)
RDW: 13.7 % (ref 12.3–15.4)
WBC: 5.2 10*3/uL (ref 3.4–10.8)

## 2015-01-16 LAB — COMPREHENSIVE METABOLIC PANEL
ALT: 16 IU/L (ref 0–44)
AST: 32 IU/L (ref 0–40)
Albumin/Globulin Ratio: 1.3 (ref 1.1–2.5)
Albumin: 3.8 g/dL (ref 3.5–4.8)
Alkaline Phosphatase: 90 IU/L (ref 39–117)
BUN/Creatinine Ratio: 9 — ABNORMAL LOW (ref 10–22)
BUN: 8 mg/dL (ref 8–27)
Bilirubin Total: 0.5 mg/dL (ref 0.0–1.2)
CALCIUM: 9.1 mg/dL (ref 8.6–10.2)
CO2: 22 mmol/L (ref 18–29)
CREATININE: 0.85 mg/dL (ref 0.76–1.27)
Chloride: 105 mmol/L (ref 96–106)
GFR calc Af Amer: 102 mL/min/{1.73_m2} (ref >59)
GFR calc non Af Amer: 88 mL/min/{1.73_m2} (ref >59)
GLOBULIN, TOTAL: 3 g/dL (ref 1.5–4.5)
Glucose: 79 mg/dL (ref 65–99)
Potassium: 4.4 mmol/L (ref 3.5–5.2)
Sodium: 142 mmol/L (ref 134–144)
Total Protein: 6.8 g/dL (ref 6.0–8.5)

## 2015-01-16 LAB — LIPID PANEL
CHOL/HDL RATIO: 2.9 ratio (ref 0.0–5.0)
Cholesterol, Total: 196 mg/dL (ref 100–199)
HDL: 68 mg/dL (ref >39)
LDL CALC: 110 mg/dL — AB (ref 0–99)
TRIGLYCERIDES: 90 mg/dL (ref 0–149)
VLDL Cholesterol Cal: 18 mg/dL (ref 5–40)

## 2015-01-16 LAB — PSA: Prostate Specific Ag, Serum: 3.1 ng/mL (ref 0.0–4.0)

## 2015-02-17 ENCOUNTER — Ambulatory Visit (AMBULATORY_SURGERY_CENTER): Payer: Self-pay | Admitting: *Deleted

## 2015-02-17 VITALS — Ht 63.0 in | Wt 135.4 lb

## 2015-02-17 DIAGNOSIS — Z1211 Encounter for screening for malignant neoplasm of colon: Secondary | ICD-10-CM

## 2015-02-17 MED ORDER — NA SULFATE-K SULFATE-MG SULF 17.5-3.13-1.6 GM/177ML PO SOLN: ORAL | Status: AC

## 2015-02-17 NOTE — Progress Notes (Signed)
No allergies to eggs or soy. No problems with anesthesia.  Pt given Emmi instructions for colonoscopy  No oxygen use  No diet drug use  

## 2015-03-03 ENCOUNTER — Encounter: Payer: Self-pay | Admitting: Gastroenterology

## 2015-03-27 ENCOUNTER — Ambulatory Visit (INDEPENDENT_AMBULATORY_CARE_PROVIDER_SITE_OTHER): Payer: Medicare Other | Admitting: Urgent Care

## 2015-03-27 VITALS — BP 108/60 | HR 58 | Temp 98.1°F | Resp 16 | Ht 63.5 in | Wt 131.0 lb

## 2015-03-27 DIAGNOSIS — H578 Other specified disorders of eye and adnexa: Secondary | ICD-10-CM | POA: Diagnosis not present

## 2015-03-27 DIAGNOSIS — T1591XA Foreign body on external eye, part unspecified, right eye, initial encounter: Secondary | ICD-10-CM

## 2015-03-27 NOTE — Progress Notes (Signed)
    MRN: [id number] DOB: [birth year]  Subjective:   Alexander Bass is a 70 y.o. male presenting for chief complaint of Eye Problem  Reports 3 day history of foreign body sensation in his right eye, has redness, itchy eye. Has not tried any medications for this. He denies fever, eye pain, blurred vision, trauma, photosensitivity.  Alexander Bass has a current medication list which includes the following prescription(s): vitamin e, na sulfate-k sulfate-mg sulf, and NON FORMULARY. Also has No Known Allergies.  Alexander Bass  has no past medical history on file. Also  has past surgical history that includes Brain surgery (2010) and Inguinal hernia repair (Right, 2008).  Objective:   Vitals: BP 108/60 mmHg  Pulse 58  Temp(Src) 98.1 F (36.7 C)  Resp 16  Ht 5' 3.5" (1.613 m)  Wt 131 lb (59.421 kg)  BMI 22.84 kg/m2  SpO2 99%  Physical Exam  Constitutional: He appears well-developed and well-nourished.  HENT:  TM's intact bilaterally, no effusions or erythema. Nasal turbinates pink and moist, nasal passages patent. No sinus tenderness. Oropharynx clear, mucous membranes moist, dentition in good repair.  Eyes: Right eye exhibits no discharge. Left eye exhibits no discharge. No scleral icterus.  There is one eyelash embedded in eyelid that is persistently making contact with patient's conjunctiva of right eye.   PROCEDURE NOTE: right eye exam Verbal consent obtained. Eyelids everted and swept for foreign body. The eye was stained with fluorescein. Examination under woods lamp does not reveal an area of increased stain uptake. However, one eye lash was removed from eyelid as it was persistently making contact with his conjunctiva. The eye was then irrigated copiously with saline.  Assessment and Plan :   1. Foreign body, eye, right, initial encounter - Patient reported significant relief after removal of eyelash. Counseled on signs and symptoms of infection. Patient is to rtc if these symptoms  develop.  Wallis BambergMario Javel Hersh, PA-C [facility] [phone] 03/27/2015 7:23 Bass

## 2015-07-16 ENCOUNTER — Ambulatory Visit: Payer: Medicare Other | Admitting: Nurse Practitioner
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
